# Patient Record
Sex: Female | Born: 1995 | Race: Black or African American | Hispanic: No | Marital: Single | Smoking: Never smoker
Health system: Southern US, Community
[De-identification: ages and names within clinical notes are randomized; demographics above are authoritative.]

## PROBLEM LIST (undated history)

## (undated) DIAGNOSIS — E669 Obesity, unspecified: Secondary | ICD-10-CM

---
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
## 2013-03-05 ENCOUNTER — Encounter: Payer: Self-pay | Admitting: Family Medicine

## 2013-03-05 ENCOUNTER — Ambulatory Visit (INDEPENDENT_AMBULATORY_CARE_PROVIDER_SITE_OTHER): Payer: Medicaid Other | Admitting: Family Medicine

## 2013-03-05 VITALS — BP 116/75 | HR 81 | Temp 99.0°F | Resp 16 | Ht 68.5 in | Wt 248.9 lb

## 2013-03-05 DIAGNOSIS — H6091 Unspecified otitis externa, right ear: Secondary | ICD-10-CM

## 2013-03-05 DIAGNOSIS — H60391 Other infective otitis externa, right ear: Secondary | ICD-10-CM

## 2013-03-05 DIAGNOSIS — H60399 Other infective otitis externa, unspecified ear: Secondary | ICD-10-CM

## 2013-03-05 MED ORDER — NEOMYCIN-COLIST-HC-THONZONIUM 3.3-3-10-0.5 MG/ML OT SUSP: 3.0000 [drp] | Freq: Four times a day (QID) | OTIC | Status: DC

## 2013-03-05 NOTE — Patient Instructions (Signed)
You have a condition called otitis externa. It should get better with drops. Come back if you get worse or still have pain in 5-7 days.   Sincerely,  Dr. Clinton Sawyer  Otitis Externa Otitis externa is a bacterial or fungal infection of the outer ear canal. This is the area from the eardrum to the outside of the ear. Otitis externa is sometimes called "swimmer's ear." CAUSES  Possible causes of infection include:  Swimming in dirty water.  Moisture remaining in the ear after swimming or bathing.  Mild injury (trauma) to the ear.  Objects stuck in the ear (foreign body).  Cuts or scrapes (abrasions) on the outside of the ear. SYMPTOMS  The first symptom of infection is often itching in the ear canal. Later signs and symptoms may include swelling and redness of the ear canal, ear pain, and yellowish-white fluid (pus) coming from the ear. The ear pain may be worse when pulling on the earlobe. DIAGNOSIS  Your caregiver will perform a physical exam. A sample of fluid may be taken from the ear and examined for bacteria or fungi. TREATMENT  Antibiotic ear drops are often given for 10 to 14 days. Treatment may also include pain medicine or corticosteroids to reduce itching and swelling. PREVENTION   Keep your ear dry. Use the corner of a towel to absorb water out of the ear canal after swimming or bathing.  Avoid scratching or putting objects inside your ear. This can damage the ear canal or remove the protective wax that lines the canal. This makes it easier for bacteria and fungi to grow.  Avoid swimming in lakes, polluted water, or poorly chlorinated pools.  You may use ear drops made of rubbing alcohol and vinegar after swimming. Combine equal parts of white vinegar and alcohol in a bottle. Put 3 or 4 drops into each ear after swimming. HOME CARE INSTRUCTIONS   Apply antibiotic ear drops to the ear canal as prescribed by your caregiver.  Only take over-the-counter or prescription  medicines for pain, discomfort, or fever as directed by your caregiver.  If you have diabetes, follow any additional treatment instructions from your caregiver.  Keep all follow-up appointments as directed by your caregiver. SEEK MEDICAL CARE IF:   You have a fever.  Your ear is still red, swollen, painful, or draining pus after 3 days.  Your redness, swelling, or pain gets worse.  You have a severe headache.  You have redness, swelling, pain, or tenderness in the area behind your ear. MAKE SURE YOU:   Understand these instructions.  Will watch your condition.  Will get help right away if you are not doing well or get worse. Document Released: 08/15/2005 Document Revised: 11/07/2011 Document Reviewed: 09/01/2011 [facility] Patient Information 2014 [place], [place].

## 2013-03-05 NOTE — Assessment & Plan Note (Signed)
Likely infectious etiology since it started after swimming last week. Will use corticosporin otic drops. Return to clinic if worsening symptoms.

## 2013-03-05 NOTE — Progress Notes (Signed)
  Subjective:    Patient ID: Normajean Glasgow, female    DOB: August 12, 1996, 17 y.o.   MRN: [id number]  HPI Right Sided Ear Pain   Symptoms: right sided ear pain - feels "swollen, " hurts if she puts pressure on it or touches it Associated symptoms: no fever, chills, nausea, vomiting, pharyngitis Location: right ear Duration: 4 days  Course: worsening over the last day Alleviating: nothing tried Exacerbating: nothing  History: swimming in a pool last week   Review of Systems     Objective:   Physical Exam BP 116/75  Pulse 81  Temp(Src) 99 F (37.2 C)  Resp 16  Ht 5' 8.5" (1.74 m)  Wt 248 lb 14.4 oz (112.9 kg)  BMI 37.29 kg/m2  SpO2 100% Well appearing  No lymphadenopathy submental or submanidbular, OP clear without erythema or exudate Right sided otalgia, swollen erythematous external ear canal, no effusion or bulging of the TM Left ear: normal      Assessment & Plan:  OTITIS EXTERNA, RIGHT

## 2013-03-05 NOTE — Progress Notes (Signed)
Patient complains of pain to right ear past  Couple of days.

## 2013-03-08 ENCOUNTER — Encounter: Payer: Self-pay | Admitting: Family Medicine

## 2013-03-08 NOTE — Progress Notes (Signed)
Ear drops that were prescribed at this visit require prior authorization - can pharmacy change to plain Cortisporin otic and not "TC". Wyatt Haste, RN-BSN

## 2013-03-08 NOTE — Telephone Encounter (Signed)
error 

## 2013-04-26 ENCOUNTER — Telehealth: Payer: Self-pay | Admitting: Family Medicine

## 2013-04-26 NOTE — Telephone Encounter (Signed)
It has been more than 1.5 months since these were prescribed. She needs to be evaluated before any prescription will be given, especially since it is now in both ears. She can come in today for a walk in visit or urgent care. Thanks.

## 2013-04-26 NOTE — Telephone Encounter (Signed)
Patient is calling because the medication that was prescribed for her ear infection is not covered by her insurance and now the infection is in both ears.  She is asking if a different medication can be prescribed that is covered by her insurance and enough for her to use in both ears.

## 2013-04-26 NOTE — Telephone Encounter (Signed)
Pt called and informed. Elizabeth Fulton Merry, RN-BSN  

## 2013-04-26 NOTE — Telephone Encounter (Signed)
Please see note below - pt has medicaid. Wyatt Haste, RN-BSN

## 2013-04-30 ENCOUNTER — Other Ambulatory Visit: Payer: Self-pay | Admitting: *Deleted

## 2013-04-30 DIAGNOSIS — H60391 Other infective otitis externa, right ear: Secondary | ICD-10-CM

## 2013-04-30 NOTE — Telephone Encounter (Signed)
This is the second request for this... She needs to be seen and re-evaluated since the antibiotics were prescribed well over a month ago and there was a previous note saying the infection was now in both ears. Thanks. Greig Castilla

## 2013-04-30 NOTE — Telephone Encounter (Signed)
[facility] called and informed that pt would need office visit prior to filling meds. Wyatt Haste, RN-BSN

## 2013-05-26 ENCOUNTER — Other Ambulatory Visit: Payer: Self-pay | Admitting: Family Medicine

## 2013-05-27 ENCOUNTER — Ambulatory Visit (INDEPENDENT_AMBULATORY_CARE_PROVIDER_SITE_OTHER): Payer: Medicaid Other | Admitting: Family Medicine

## 2013-05-27 ENCOUNTER — Encounter: Payer: Self-pay | Admitting: Family Medicine

## 2013-05-27 VITALS — BP 113/74 | HR 89 | Ht 68.5 in | Wt 250.0 lb

## 2013-05-27 DIAGNOSIS — H60399 Other infective otitis externa, unspecified ear: Secondary | ICD-10-CM

## 2013-05-27 DIAGNOSIS — R29898 Other symptoms and signs involving the musculoskeletal system: Secondary | ICD-10-CM | POA: Insufficient documentation

## 2013-05-27 DIAGNOSIS — H6091 Unspecified otitis externa, right ear: Secondary | ICD-10-CM

## 2013-05-27 NOTE — Progress Notes (Signed)
  Subjective:    Patient ID: Stacey Hendrix, female    DOB: Dec 31, 1995, 17 y.o.   MRN: [id number]  HPI  # Left upper arm weakness - Started a month ago, comes and goes, though usually at night and the morning (doesn't notice during the afternoon) - Feels "weak" and like her arm is going to "fall off", says "numb, but not numb" - Left upper arm only, does not occur in forearm or hand - Has nexplanon in the left arm, inserted April 2014. - No pain, no numbness or tingling - No history of trauma to the area, doesn't participate in physical activity that requires repetitive use of the arm. ROS: weight gain of 12lbs since March (only 2lbs since July), no radicular pain, no chest pain or shortness of breath, no other weakness around body  # Ear infection: - Had bilateral ear pain 1 month ago, has gone away and not recurred.  - Had ear pain in July and diagnosed with otitis externa, given prescription but wasn't able to fill due to insurance not covering it ROS: no rhinorrhea, cough, fevers, chills.  Review of Systems See HPI    Objective:   Physical Exam BP 113/74  Pulse 89  Ht 5' 8.5" (1.74 m)  Wt 250 lb (113.399 kg)  BMI 37.46 kg/m2  HEENT: TMs pearly gray with good light reflex bilaterally, cerumen present in ear canals CV: RRR, normal heart sounds, no murmurs appreciated Resp: CTAB, normal effort Extremities:  Left arm: nexplanon present in upper arm next to well healed insertion scar. No visible lesions on the arm. Strength 5/5 biceps, triceps, wrist flexion/extension, grip strength, finger abduction/adduction.  Right arm: Strength 5/5 biceps, triceps, wrist flexion/extension, grip strength, finger abduction/adduction.  2+ reflexes biceps, triceps, brachioradialis bilaterally 2+ radial pulses bilaterally     Assessment & Plan:  See Problem List Documentation

## 2013-05-27 NOTE — Assessment & Plan Note (Signed)
Never filled antibiotic prescription because her insurance did not cover it. Infection went away and came back in both ears in August. She was not seen by a provider and the pain went away. Plan: - She understands if it recurs she needs to be seen by someone so that the proper treatment can be prescribed.

## 2013-05-27 NOTE — Patient Instructions (Signed)
It was nice to meet you today.   The arm weakness does not appear to be anything that is concerning at this time. If the symptoms persist, worsen, you start to get pain, please call and schedule an appointment or go to the ED.   For your ear infections, we would need to see you in clinic or urgent care in order to determine the most appropriate treatment at that time.

## 2013-05-27 NOTE — Assessment & Plan Note (Signed)
Left upper arm only, present for 1 month, comes and goes. Same arm as nexplanon was inserted into in April. Denies trauma to the area, has not been participating in sports or recurrent physical activity using the arm. No pain, no actual weakness or numbness, doesn't describe nerve pain. Plan: - She does not want nexplanon removed - No treatment or imaging at this time - Schedule another appointment if symptoms worsen.

## 2013-06-28 ENCOUNTER — Ambulatory Visit (INDEPENDENT_AMBULATORY_CARE_PROVIDER_SITE_OTHER): Payer: Medicaid Other | Admitting: Family Medicine

## 2013-06-28 ENCOUNTER — Encounter: Payer: Self-pay | Admitting: Family Medicine

## 2013-06-28 VITALS — BP 116/64 | HR 80 | Temp 99.4°F | Ht 68.5 in | Wt 255.0 lb

## 2013-06-28 DIAGNOSIS — R300 Dysuria: Secondary | ICD-10-CM

## 2013-06-28 DIAGNOSIS — N898 Other specified noninflammatory disorders of vagina: Secondary | ICD-10-CM

## 2013-06-28 LAB — POCT WET PREP (WET MOUNT)

## 2013-06-28 LAB — POCT UA - MICROSCOPIC ONLY

## 2013-06-28 LAB — POCT URINALYSIS DIPSTICK
Bilirubin, UA: NEGATIVE
Blood, UA: NEGATIVE
Glucose, UA: NEGATIVE
Ketones, UA: NEGATIVE
Nitrite, UA: NEGATIVE

## 2013-06-28 NOTE — Patient Instructions (Signed)
Safe Sex Safe sex is about reducing the risk of giving or getting a sexually transmitted disease (STD). STDs are spread through sexual contact involving the genitals, mouth, or rectum. Some STDS can be cured and others cannot. Safe sex can also prevent unintended pregnancies.  SAFE SEX PRACTICES  Limit your sexual activity to only one partner who is only having sex with you.  Talk to your partner about their past partners, past STDs, and drug use.  Use a condom every time you have sexual intercourse. This includes vaginal, oral, and anal sexual activity. Both females and males should wear condoms during oral sex. Only use latex or polyurethane condoms and water-based lubricants. Petroleum-based lubricants or oils used to lubricate a condom will weaken the condom and increase the chance that it will break. The condom should be in place from the beginning to the end of sexual activity. Wearing a condom reduces, but does not completely eliminate, your risk of getting or giving a STD. STDs can be spread by contact with skin of surrounding areas.  Get vaccinated for hepatitis B and HPV.  Avoid alcohol and recreational drugs which can affect your judgement. You may forget to use a condom or participate in high-risk sex.  For females, avoid douching after sexual intercourse. Douching can spread an infection farther into the reproductive tract.  Check your body for signs of sores, blisters, rashes, or unusual discharge. See your caregiver if you notice any of these signs.  Avoid sexual contact if you have symptoms of an infection or are being treated for an STD. If you or your partner has herpes, avoid sexual contact when blisters are present. Use condoms at all other times.  See your caregiver for regular screenings, examinations, and tests for STDs. Before having sex with a new partner, each of you should be screened for STDs and talk about the results with your partner. BENEFITS OF SAFE SEX   There  is less of a chance of getting or giving an STD.  You can prevent unwanted or unintended pregnancies.  By discussing safer sex concerns with your partner, you may increase feelings of intimacy, comfort, trust, and honesty between the both of you. Document Released: 09/22/2004 Document Revised: 05/09/2012 Document Reviewed: 02/06/2012 [facility] Patient Information 2014 [place], [place].  It was a pleasure meeting you today. I will call you with result this evening.

## 2013-06-30 ENCOUNTER — Telehealth: Payer: Self-pay | Admitting: Family Medicine

## 2013-06-30 DIAGNOSIS — R300 Dysuria: Secondary | ICD-10-CM | POA: Insufficient documentation

## 2013-06-30 NOTE — Assessment & Plan Note (Addendum)
Wet prep obtained today is normal. UA is normal as well, patient informed of results by telephone. After exam and history I discussed with the patient the lacerations located at vaginal introitus. I explained this is likely the cause of her burning with urination. It was confirmed it was the exact location of discomfort. Discussion on safety warranted no red flags. Explained how lacerations can occur and ways to prevent them from occuring in the future. Patient was in understanding. AVS on safe sex practices given F/U PRN

## 2013-06-30 NOTE — Progress Notes (Signed)
Subjective:     Patient ID: Normajean Glasgow, female   DOB: 08/05/1996, 17 y.o.   MRN: [id number]  HPI  Dysuria: Patient reports vaginal irritation that started two days ago after shaving. She has had h/o of folliculitis and is uncertain, because of location, if this is the same thing. The discomfort occurs after urination and burns on the outside. She has no frequency of urination, or incomplete empty of bladder. She denies fever or abdominal pain. She is sexually active with one female partner (of a few years). Last intercourse was 2 days ago. LMP: on nexplanon, does not have monthly menses. She has no concerns of STD exposure and does not wish to be tested for G/C today.   Review of Systems Negative, with the exception of above mentioned in HPI  Objective:   Physical Exam BP 116/64  Pulse 80  Temp(Src) 99.4 F (37.4 C) (Oral)  Ht 5' 8.5" (1.74 m)  Wt 255 lb (115.667 kg)  BMI 38.20 kg/m2 Gen: NAD. Pleasant Abd: Soft. obese. NTND. BS present. no Masses palpated.  GYN:  External genitalia within normal limits, with small lacerations (x4) at introitus.  Vaginal mucosa pink, moist, normal rugae.  Nonfriable cervix without lesions, no discharge or bleeding noted on speculum exam.  Bimanual exam revealed normal, nongravid uterus.  No cervical motion tenderness. No adnexal masses bilaterally.

## 2013-06-30 NOTE — Telephone Encounter (Signed)
Please call Stacey Hendrix and inform her all her test are normal and with no signs of infection. Thanks.

## 2013-07-01 NOTE — Telephone Encounter (Signed)
Left message on voicemail. Stacey Hendrix S  

## 2013-07-21 ENCOUNTER — Other Ambulatory Visit: Payer: Self-pay | Admitting: Family Medicine

## 2013-07-23 ENCOUNTER — Encounter: Payer: Self-pay | Admitting: Family Medicine

## 2013-08-22 ENCOUNTER — Other Ambulatory Visit: Payer: Self-pay | Admitting: Family Medicine

## 2013-08-23 ENCOUNTER — Other Ambulatory Visit: Payer: Self-pay | Admitting: Family Medicine

## 2013-08-26 NOTE — Telephone Encounter (Signed)
Pt is aware of this. Stacey Hendrix,CMA  

## 2013-10-02 ENCOUNTER — Ambulatory Visit: Payer: Medicaid Other

## 2013-10-22 ENCOUNTER — Ambulatory Visit: Payer: Medicaid Other | Admitting: Family Medicine

## 2013-10-22 ENCOUNTER — Encounter (HOSPITAL_COMMUNITY): Payer: Self-pay | Admitting: Emergency Medicine

## 2013-10-22 DIAGNOSIS — R000 Tachycardia, unspecified: Secondary | ICD-10-CM | POA: Insufficient documentation

## 2013-10-22 DIAGNOSIS — R509 Fever, unspecified: Secondary | ICD-10-CM | POA: Insufficient documentation

## 2013-10-22 DIAGNOSIS — Z3202 Encounter for pregnancy test, result negative: Secondary | ICD-10-CM | POA: Insufficient documentation

## 2013-10-22 DIAGNOSIS — R197 Diarrhea, unspecified: Secondary | ICD-10-CM | POA: Insufficient documentation

## 2013-10-22 DIAGNOSIS — Z7982 Long term (current) use of aspirin: Secondary | ICD-10-CM | POA: Insufficient documentation

## 2013-10-22 DIAGNOSIS — R112 Nausea with vomiting, unspecified: Secondary | ICD-10-CM | POA: Insufficient documentation

## 2013-10-22 NOTE — ED Notes (Signed)
Pt. reports generalized body aches with emesis ,diarrhea and chills onset Saturday .

## 2013-10-23 ENCOUNTER — Ambulatory Visit: Payer: Medicaid Other | Admitting: Family Medicine

## 2013-10-23 ENCOUNTER — Emergency Department (HOSPITAL_COMMUNITY)
Admission: EM | Admit: 2013-10-23 | Discharge: 2013-10-23 | Disposition: A | Discharge status: Home / Self Care | Payer: Medicaid Other | Attending: Emergency Medicine | Admitting: Emergency Medicine

## 2013-10-23 DIAGNOSIS — R112 Nausea with vomiting, unspecified: Secondary | ICD-10-CM

## 2013-10-23 DIAGNOSIS — R197 Diarrhea, unspecified: Secondary | ICD-10-CM

## 2013-10-23 LAB — PREGNANCY, URINE: PREG TEST UR: NEGATIVE

## 2013-10-23 MED ORDER — PROMETHAZINE HCL 25 MG PO TABS: 25.0000 mg | ORAL_TABLET | Freq: Four times a day (QID) | ORAL | Status: DC | PRN

## 2013-10-23 MED ORDER — IBUPROFEN 800 MG PO TABS: 800.0000 mg | ORAL_TABLET | Freq: Three times a day (TID) | ORAL | Status: DC

## 2013-10-23 MED ORDER — ONDANSETRON HCL 4 MG/2ML IJ SOLN
4.0000 mg | Freq: Once | INTRAMUSCULAR | Status: AC
  Administered 2013-10-23: 4 mg via INTRAVENOUS
  Filled 2013-10-23: qty 2

## 2013-10-23 MED ORDER — SODIUM CHLORIDE 0.9 % IV BOLUS (SEPSIS)
1000.0000 mL | Freq: Once | INTRAVENOUS | Status: AC
  Administered 2013-10-23: 1000 mL via INTRAVENOUS

## 2013-10-23 MED ORDER — KETOROLAC TROMETHAMINE 60 MG/2ML IM SOLN
60.0000 mg | Freq: Once | INTRAMUSCULAR | Status: AC
  Administered 2013-10-23: 60 mg via INTRAMUSCULAR

## 2013-10-23 MED ORDER — KETOROLAC TROMETHAMINE 30 MG/ML IJ SOLN: 30.0000 mg | Freq: Once | INTRAMUSCULAR | Status: DC

## 2013-10-23 MED ORDER — KETOROLAC TROMETHAMINE 60 MG/2ML IM SOLN
INTRAMUSCULAR | Status: AC
  Filled 2013-10-23: qty 2

## 2013-10-23 NOTE — Discharge Instructions (Signed)
Viral Infections °A virus is a type of germ. Viruses can cause: °· Minor sore throats. °· Aches and pains. °· Headaches. °· Runny nose. °· Rashes. °· Watery eyes. °· Tiredness. °· Coughs. °· Loss of appetite. °· Feeling sick to your stomach (nausea). °· Throwing up (vomiting). °· Watery poop (diarrhea). °HOME CARE  °· Only take medicines as told by your doctor. °· Drink enough water and fluids to keep your pee (urine) clear or pale yellow. Sports drinks are a good choice. °· Get plenty of rest and eat healthy. Soups and broths with crackers or rice are fine. °GET HELP RIGHT AWAY IF:  °· You have a very bad headache. °· You have shortness of breath. °· You have chest pain or neck pain. °· You have an unusual rash. °· You cannot stop throwing up. °· You have watery poop that does not stop. °· You cannot keep fluids down. °· You or your child has a temperature by mouth above 102° F (38.9° C), not controlled by medicine. °· Your baby is older than 3 months with a rectal temperature of 102° F (38.9° C) or higher. °· Your baby is 3 months old or younger with a rectal temperature of 100.4° F (38° C) or higher. °MAKE SURE YOU:  °· Understand these instructions. °· Will watch this condition. °· Will get help right away if you are not doing well or get worse. °Document Released: 07/28/2008 Document Revised: 11/07/2011 Document Reviewed: 12/21/2010 °Exit[employer]® Patient Information ©2014 [employer]. ° °

## 2013-10-23 NOTE — ED Provider Notes (Signed)
CSN: [id number]     Arrival date & time 10/22/13  2025 History   First MD Initiated Contact with Patient 10/23/13 [phone]     Chief Complaint  Patient presents with  . Generalized Body Aches  . Emesis  . Diarrhea     (Consider location/radiation/quality/duration/timing/severity/associated sxs/prior Treatment) HPI History provided by patient. Onset of symptoms about 3 days ago, sick contacts at home with similar symptoms now resolved. Started with generalized body aches and some nausea and has now developed vomiting and diarrhea for the last 2 days unable to hold anything down. She hurts all of her denies any specific abdominal pain, chest pain. No cough or difficulty breathing. No sore throat. Has had some congestion. No rash or recent travel. Symptoms moderate severity. Patient concerned that she may have the flu. No blood in emesis or stools.  History reviewed. No pertinent past medical history. History reviewed. No pertinent past surgical history. Family History  Problem Relation Age of Onset  . Alcohol abuse Mother   . Depression Mother    History  Substance Use Topics  . Smoking status: Never Smoker   . Smokeless tobacco: Not on file  . Alcohol Use: No   OB History   Grav Para Term Preterm Abortions TAB SAB Ect Mult Living                 Review of Systems  Constitutional: Positive for fever and chills.  Eyes: Negative for visual disturbance.  Respiratory: Negative for shortness of breath.   Cardiovascular: Negative for chest pain.  Gastrointestinal: Positive for nausea, vomiting and diarrhea.  Genitourinary: Negative for dysuria.  Musculoskeletal: Negative for neck pain and neck stiffness.  Skin: Negative for rash.  Neurological: Negative for seizures and syncope.  All other systems reviewed and are negative.      Allergies  Review of patient's allergies indicates no known allergies.  Home Medications   Current Outpatient Rx  Name  Route  Sig  Dispense   Refill  . etonogestrel (NEXPLANON) 68 MG IMPL implant   Subcutaneous   Inject 1 each into the skin once. Implanted November 28, 2012         . Phenyleph-Doxyl-DM-Aspirin (ALKA-SELTZER PLUS DAY/NIGHT PO)   Oral   Take 2 tablets by mouth 2 (two) times daily as needed (cold/flu symptoms).          BP 115/53  Pulse 109  Temp(Src) 99.7 F (37.6 C) (Oral)  Resp 15  Ht 5\' 8"  (1.727 m)  Wt 266 lb (120.657 kg)  BMI 40.45 kg/m2  SpO2 100% Physical Exam  Constitutional: She is oriented to person, place, and time. She appears well-developed and well-nourished.  HENT:  Head: Normocephalic and atraumatic.  Mouth/Throat: No oropharyngeal exudate.  Eyes: EOM are normal. Pupils are equal, round, and reactive to light. No scleral icterus.  Neck: Neck supple. No tracheal deviation present.  Cardiovascular: Regular rhythm and intact distal pulses.   Tachycardic  Pulmonary/Chest: Effort normal and breath sounds normal. No stridor. No respiratory distress. She exhibits no tenderness.  Abdominal: Soft. Bowel sounds are normal. She exhibits no distension. There is no tenderness.  No CVA tenderness  Musculoskeletal: Normal range of motion. She exhibits no edema.  Neurological: She is alert and oriented to person, place, and time. No cranial nerve deficit. Coordination normal.  Skin: Skin is warm and dry.    ED Course  Procedures (including critical care time) Labs Review Labs Reviewed  PREGNANCY, URINE   IV fluids. IV  Zofran IV Toradol  2:54 AM tolerating PO fluids without further nausea or vomiting. Repeat abdominal exam remains soft, nontender, nondistended. Urine pregnancy test negative.    Plan discharge home with prescription for Phenergan and Motrin. Strict return precautions verbalized as understood. Patient agrees to rest and hydration.   MDM   Diagnosis: Nausea vomiting diarrhea  Suspect viral gastroenteritis. Serial abdominal exams benign Treated with IV fluids and medications  as above Condition improved Vital signs and nursing notes reviewed and considered    Stacey NielsenBrian Jacqueleen Pulver, MD 10/23/13 [phone]

## 2013-12-06 ENCOUNTER — Ambulatory Visit: Payer: Medicaid Other | Admitting: Family Medicine

## 2013-12-09 ENCOUNTER — Encounter: Payer: Self-pay | Admitting: Family Medicine

## 2013-12-09 ENCOUNTER — Ambulatory Visit (INDEPENDENT_AMBULATORY_CARE_PROVIDER_SITE_OTHER): Payer: Medicaid Other | Admitting: Family Medicine

## 2013-12-09 ENCOUNTER — Other Ambulatory Visit (HOSPITAL_COMMUNITY)
Admission: RE | Admit: 2013-12-09 | Discharge: 2013-12-09 | Disposition: A | Discharge status: Home / Self Care | Payer: Medicaid Other | Source: Ambulatory Visit | Attending: Family Medicine | Admitting: Family Medicine

## 2013-12-09 VITALS — BP 126/57 | HR 97 | Temp 99.3°F | Ht 69.0 in | Wt 271.0 lb

## 2013-12-09 DIAGNOSIS — N898 Other specified noninflammatory disorders of vagina: Secondary | ICD-10-CM

## 2013-12-09 DIAGNOSIS — J301 Allergic rhinitis due to pollen: Secondary | ICD-10-CM

## 2013-12-09 DIAGNOSIS — R634 Abnormal weight loss: Secondary | ICD-10-CM

## 2013-12-09 DIAGNOSIS — Z113 Encounter for screening for infections with a predominantly sexual mode of transmission: Secondary | ICD-10-CM | POA: Insufficient documentation

## 2013-12-09 DIAGNOSIS — L732 Hidradenitis suppurativa: Secondary | ICD-10-CM

## 2013-12-09 DIAGNOSIS — E669 Obesity, unspecified: Secondary | ICD-10-CM

## 2013-12-09 DIAGNOSIS — Z202 Contact with and (suspected) exposure to infections with a predominantly sexual mode of transmission: Secondary | ICD-10-CM

## 2013-12-09 LAB — POCT WET PREP (WET MOUNT): Clue Cells Wet Prep Whiff POC: NEGATIVE

## 2013-12-09 MED ORDER — LEVOCETIRIZINE DIHYDROCHLORIDE 5 MG PO TABS: 5.0000 mg | ORAL_TABLET | Freq: Every evening | ORAL | Status: DC

## 2013-12-09 NOTE — Assessment & Plan Note (Signed)
Wt up +21lbs since 04/2013.  P: referral placed for Dr. Gerilyn PilgrimSykes for nutritional therapy.

## 2013-12-09 NOTE — Assessment & Plan Note (Signed)
Worsened symptoms over past several weeks with high pollen counts. OTC medications without relief. P: xyzal 5mg  daily

## 2013-12-09 NOTE — Patient Instructions (Addendum)
I will call you if the results of your labs are not normal, otherwise we will send you a letter. If you do not hear from the clinic in 2 weeks, please call.  Schedule a "Same Day" appointment when any new bumps start to appear. It would be best if someone can examine you while you have them.

## 2013-12-09 NOTE — Assessment & Plan Note (Signed)
Suspect she is having additional flares (though no lesions currently). Will need to discuss further with her about weight loss (she has requested nutritional therapy referral), avoiding excessive soap use, diet control. Asked patient to schedule same day appt if further lesions appear (cannot say if this is exactly what she is referring to as there is nothing on exam).

## 2013-12-09 NOTE — Progress Notes (Signed)
Patient ID: Normajean GlasgowDasha V Stankey, female   DOB: 1995/12/09, 18 y.o.   MRN: [id number]   Subjective:    Patient ID: Normajean GlasgowDasha V Mittelstaedt, female    DOB: 1995/12/09, 18 y.o.   MRN: [id number]  HPI  CC: Bumps in groin  # Bumps / (?Hidradenitis):  First noticed when she was 18yo, has been seen multiple times for this problem  Occur sometimes in "waves", may have only a few but sometimes many more  Sometimes express pus, though does not mention bad odor  Painful ROS: no vaginal discharge, no excessive bleeding, no fevers/chills  # Allergies  Having worsened allergies over past several weeks  OTC medications like zyrtec don't seem to help ROS: +rhinorrhea, congestion, no cough, no rashes  Review of Systems   See HPI for ROS. Objective:  BP 126/57  Pulse 97  Temp(Src) 99.3 F (37.4 C) (Oral)  Ht 5\' 9"  (1.753 m)  Wt 271 lb (122.925 kg)  BMI 40.00 kg/m2  General: NAD HEENT: PERRL, EOMI, no scleral erythema. Nasal discharge present. CV: RRR, normal heart sounds, no murmurs Resp: CTAB, normal effort Ext: no edema or cyanosis.  GU: multiple hypopigmented scares around groin, vulva, 3 located near buttocks. No tender areas, no fluctuance or masses. Spec exam: NAVM, small amount white/green discharge. Bimanual: normal uterus, ovaries, no CMT  Above exam supervised by nurse     Assessment & Plan:  See Problem List Documentation  More than 50% of 25 minute visit spent face-to-face counseling with patient.

## 2013-12-10 LAB — CERVICOVAGINAL ANCILLARY ONLY
CHLAMYDIA, DNA PROBE: NEGATIVE
Neisseria Gonorrhea: NEGATIVE

## 2013-12-11 ENCOUNTER — Encounter: Payer: Self-pay | Admitting: Family Medicine

## 2013-12-13 ENCOUNTER — Other Ambulatory Visit (HOSPITAL_COMMUNITY)
Admission: RE | Admit: 2013-12-13 | Discharge: 2013-12-13 | Disposition: A | Discharge status: Home / Self Care | Payer: Medicaid Other | Source: Ambulatory Visit | Attending: Family Medicine | Admitting: Family Medicine

## 2013-12-13 ENCOUNTER — Emergency Department (INDEPENDENT_AMBULATORY_CARE_PROVIDER_SITE_OTHER)
Admission: EM | Admit: 2013-12-13 | Discharge: 2013-12-13 | Disposition: A | Discharge status: Home / Self Care | Payer: Medicaid Other | Source: Home / Self Care | Attending: Family Medicine | Admitting: Family Medicine

## 2013-12-13 DIAGNOSIS — N760 Acute vaginitis: Secondary | ICD-10-CM | POA: Insufficient documentation

## 2013-12-13 DIAGNOSIS — Z202 Contact with and (suspected) exposure to infections with a predominantly sexual mode of transmission: Secondary | ICD-10-CM

## 2013-12-13 DIAGNOSIS — Z113 Encounter for screening for infections with a predominantly sexual mode of transmission: Secondary | ICD-10-CM | POA: Insufficient documentation

## 2013-12-13 LAB — HIV ANTIBODY (ROUTINE TESTING W REFLEX): HIV 1&2 Ab, 4th Generation: NONREACTIVE

## 2013-12-13 LAB — POCT URINALYSIS DIP (DEVICE)
BILIRUBIN URINE: NEGATIVE
GLUCOSE, UA: NEGATIVE mg/dL
Ketones, ur: NEGATIVE mg/dL
Nitrite: NEGATIVE
Protein, ur: NEGATIVE mg/dL
Specific Gravity, Urine: 1.02 (ref 1.005–1.030)
UROBILINOGEN UA: 0.2 mg/dL (ref 0.0–1.0)
pH: 7 (ref 5.0–8.0)

## 2013-12-13 LAB — CERVICOVAGINAL ANCILLARY ONLY
WET PREP (BD AFFIRM): NEGATIVE
WET PREP (BD AFFIRM): POSITIVE — AB
Wet Prep (BD Affirm): NEGATIVE

## 2013-12-13 LAB — RPR

## 2013-12-13 LAB — POCT PREGNANCY, URINE: Preg Test, Ur: NEGATIVE

## 2013-12-13 NOTE — ED Provider Notes (Signed)
Stacey Hendrix is a 18 y.o. female who presents to Urgent Care today for STD exposure. Patient's wife recently tested positive for some unknown STD. She is here for testing. She is completely asymptomatic. He feels well otherwise.   No past medical history on file. History  Substance Use Topics  . Smoking status: Never Smoker   . Smokeless tobacco: Not on file  . Alcohol Use: No   ROS as above Medications: No current facility-administered medications for this encounter.   Current Outpatient Prescriptions  Medication Sig Dispense Refill  . etonogestrel (NEXPLANON) 68 MG IMPL implant Inject 1 each into the skin once. Implanted November 28, 2012      . ibuprofen (ADVIL,MOTRIN) 800 MG tablet Take 1 tablet (800 mg total) by mouth 3 (three) times daily.  21 tablet  0  . levocetirizine (XYZAL) 5 MG tablet Take 1 tablet (5 mg total) by mouth every evening.  30 tablet  11  . Phenyleph-Doxyl-DM-Aspirin (ALKA-SELTZER PLUS DAY/NIGHT PO) Take 2 tablets by mouth 2 (two) times daily as needed (cold/flu symptoms).      . promethazine (PHENERGAN) 25 MG tablet Take 1 tablet (25 mg total) by mouth every 6 (six) hours as needed for nausea or vomiting.  30 tablet  0    Exam:  BP 109/59  Pulse 76  Temp(Src) 98 F (36.7 C) (Oral)  Resp 16  SpO2 100% Gen: Well NAD GYN: Normal external genitalia. Vaginal canal with no discharge. Normal-appearing cervix. Nontender.  Results for orders placed during the hospital encounter of 12/13/13 (from the past 24 hour(s))  POCT URINALYSIS DIP (DEVICE)     Status: Abnormal   Collection Time    12/13/13 11:03 AM      Result Value Ref Range   Glucose, UA NEGATIVE  NEGATIVE mg/dL   Bilirubin Urine NEGATIVE  NEGATIVE   Ketones, ur NEGATIVE  NEGATIVE mg/dL   Specific Gravity, Urine 1.020  1.005 - 1.030   Hgb urine dipstick MODERATE (*) NEGATIVE   pH 7.0  5.0 - 8.0   Protein, ur NEGATIVE  NEGATIVE mg/dL   Urobilinogen, UA 0.2  0.0 - 1.0 mg/dL   Nitrite NEGATIVE   NEGATIVE   Leukocytes, UA TRACE (*) NEGATIVE  POCT PREGNANCY, URINE     Status: None   Collection Time    12/13/13 11:13 AM      Result Value Ref Range   Preg Test, Ur NEGATIVE  NEGATIVE   No results found.  Assessment and Plan: 18 y.o. female with STD exposure. Cytology pending. Will call in antibiotics as needed.  Discussed warning signs or symptoms. Please see discharge instructions. Patient expresses understanding.    Rodolph BongEvan S Dominik Yordy, MD 12/13/13 [phone]

## 2013-12-13 NOTE — Discharge Instructions (Signed)
Thank you for coming in today. I will call you with test results if they are positive.  Come back as needed. If your belly pain worsens, or you have high fever, bad vomiting, blood in your stool or black tarry stool go to the Emergency Room.

## 2013-12-14 ENCOUNTER — Telehealth (HOSPITAL_COMMUNITY): Payer: Self-pay | Admitting: Family Medicine

## 2013-12-14 MED ORDER — METRONIDAZOLE 500 MG PO TABS: 500.0000 mg | ORAL_TABLET | Freq: Two times a day (BID) | ORAL | Status: DC

## 2013-12-14 NOTE — ED Notes (Signed)
BV positive Flagyl called in.   Rodolph BongEvan S Datha Kissinger, MD 12/14/13 [phone]

## 2013-12-14 NOTE — Telephone Encounter (Signed)
Message copied by Rodolph BongOREY, Tran Arzuaga S on Sat Dec 14, 2013  8:41 AM ------      Message from: Vassie MoselleYORK, SUZANNE M      Created: Fri Dec 13, 2013 11:06 PM      Regarding: lab       Gardnerella pos. Rest of labs pending. Did you want to treat?      Desiree LucySuzanne M York      12/13/2013       ------

## 2013-12-16 LAB — CERVICOVAGINAL ANCILLARY ONLY
Chlamydia: NEGATIVE
NEISSERIA GONORRHEA: NEGATIVE

## 2013-12-16 NOTE — ED Notes (Addendum)
HIV/RPR non-reactive, GC/Chlamydia neg.,  Gardnerella pos., rest of labs neg. Dr. Denyse Amassorey. wrote that he e-prescribed Flagyl and notified pt. of her results. Stacey Hendrix 12/16/2013

## 2014-06-14 ENCOUNTER — Emergency Department (HOSPITAL_COMMUNITY)
Admission: EM | Admit: 2014-06-14 | Discharge: 2014-06-14 | Disposition: A | Discharge status: Home / Self Care | Payer: Medicaid Other | Attending: Emergency Medicine | Admitting: Emergency Medicine

## 2014-06-14 ENCOUNTER — Encounter (HOSPITAL_COMMUNITY): Payer: Self-pay | Admitting: Emergency Medicine

## 2014-06-14 DIAGNOSIS — L739 Follicular disorder, unspecified: Secondary | ICD-10-CM | POA: Diagnosis not present

## 2014-06-14 DIAGNOSIS — R102 Pelvic and perineal pain: Secondary | ICD-10-CM | POA: Diagnosis present

## 2014-06-14 MED ORDER — IBUPROFEN 800 MG PO TABS: 800.0000 mg | ORAL_TABLET | Freq: Three times a day (TID) | ORAL | Status: DC | PRN

## 2014-06-14 MED ORDER — DOXYCYCLINE HYCLATE 100 MG PO CAPS: 100.0000 mg | ORAL_CAPSULE | Freq: Two times a day (BID) | ORAL | Status: DC

## 2014-06-14 NOTE — ED Notes (Signed)
Declined W/C at D/C and was escorted to lobby by RN. 

## 2014-06-14 NOTE — ED Notes (Signed)
Per pt sts vaginal bumps that have been there since she as 8. sts that she has been told it is bumps from shaving in the past and folliculitis. sts hx also of random abscesses.

## 2014-06-14 NOTE — ED Provider Notes (Signed)
CSN: [id number]     Arrival date & time 06/14/14  1346 History  This chart was scribed for non-physician practitioner Trixie DredgeEmily Abena Erdman working with Warnell Foresterrey Wofford, MD by Carl Bestelina Holson, ED Scribe. This patient was seen in room TR11C/TR11C and the patient's care was started at 3:40 PM.      Chief Complaint  Patient presents with  . Vaginal Pain    Patient is a 18 y.o. female presenting with vaginal pain. The history is provided by the patient. No language interpreter was used.  Vaginal Pain Pertinent negatives include no abdominal pain.  Vaginal Pain Pertinent negatives include no abdominal pain, chills, fever, myalgias, nausea or vomiting.   HPI Comments: Stacey Hendrix is a 18 y.o. female with a history of abscesses and folliculitis of the genital and buttock areas presents to the Emergency Department complaining of a painful vaginal lesions that started a week and a half ago.  She states that the abscess felt bigger in size after she took a shower last night.  She states that there was some mild draining from the abscess last night as well.  Her pain was a 9/10 yesterday while she was working yesterday.  The drainage is bloody and purulent.  She shaves her vaginal area regularly and only uses Dove soap in the area.    She has had vaginal abscesses since she was 8 but states that she is able to drain them herself.  She states that they normally drain pus and blood.  She does not get abscesses in her axilla or any other part of her body.   They never appear blister-like and never drain clear liquid.  Has had thorough STD tests that are all negative, has only had one sexual partner.  She denies fever, abdominal pain, dysuria, frequency, urgency, myalgias, chills, diarrhea, nausea, vomiting, vaginal bleeding, abnormal bowel movements, and vaginal discharge as associated symptoms.   History reviewed. No pertinent past medical history. History reviewed. No pertinent past surgical history. Family History   Problem Relation Age of Onset  . Alcohol abuse Mother   . Depression Mother    History  Substance Use Topics  . Smoking status: Never Smoker   . Smokeless tobacco: Not on file  . Alcohol Use: No   OB History   Grav Para Term Preterm Abortions TAB SAB Ect Mult Living                 Review of Systems  Constitutional: Negative for fever and chills.  Gastrointestinal: Negative for nausea, vomiting and abdominal pain.  Genitourinary: Negative for dysuria, urgency, frequency, decreased urine volume, vaginal bleeding, difficulty urinating and vaginal pain.  Musculoskeletal: Negative for myalgias.  Skin:       Multiple skin lesions  Allergic/Immunologic: Negative for immunocompromised state.  All other systems reviewed and are negative.     Allergies  Review of patient's allergies indicates no known allergies.  Home Medications   Prior to Admission medications   Medication Sig Start Date End Date Taking? Authorizing Provider  etonogestrel (NEXPLANON) 68 MG IMPL implant Inject 1 each into the skin once. Implanted November 28, 2012   Yes Historical Provider, MD   BP 136/115  Pulse 82  Temp(Src) 98.3 F (36.8 C) (Oral)  Resp 18  SpO2 99% Physical Exam  Nursing note and vitals reviewed. Constitutional: She appears well-developed and well-nourished. No distress.  HENT:  Head: Normocephalic and atraumatic.  Neck: Neck supple.  Pulmonary/Chest: Effort normal.  Neurological: She is alert.  Skin: Lesion noted. She is not diaphoretic.  Various pustules in different states of healing.  Some pustules intact.  No large areas of abscess or fluctuance.  No overlying erythema, edema, warmth, or significant tenderness.  No active drainage.  Few lesions with ulcerations including right labia majora.    ED Course  Procedures (including critical care time)  DIAGNOSTIC STUDIES: Oxygen Saturation is 99% on room air, normal by my interpretation.    COORDINATION OF CARE: 3:44 PM-Will  discharge the patient with antibiotics, a referral to dermatology, and pain medication.  Advised the patient to follow-up with her PCP.  The patient agreed to the treatment plan.   Labs Review Labs Reviewed - No data to display  Imaging Review No results found.   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  No current abscess.  No e/o cellulitis.  Lesion of concern on right labia majora drained spontaneously last night.  No apparent abscess - ulcerated lesion resulted.  Per patient's description doubt herpes.   D/C home with doxycycline, ibuprofen, dermatology follow up.   Discussed result, findings, treatment, and follow up  with patient.  Pt given return precautions.  Pt verbalizes understanding and agrees with plan.       I personally performed the services described in this documentation, which was scribed in my presence. The recorded information has been reviewed and is accurate.    Trixie Dredgemily Ariann Khaimov, PA-C 06/14/14 1717

## 2014-06-14 NOTE — ED Notes (Signed)
Pt getting undressed and into gown for exam.

## 2014-06-14 NOTE — Discharge Instructions (Signed)
Read the information below.  Use the prescribed medication as directed.  Please discuss all new medications with your pharmacist.  You may return to the Emergency Department at any time for worsening condition or any new symptoms that concern you.  If you develop redness, swelling, pus draining from the wound, or fevers greater than 100.4, return to the ER immediately for a recheck.     Folliculitis  Folliculitis is redness, soreness, and swelling (inflammation) of the hair follicles. This condition can occur anywhere on the body. People with weakened immune systems, diabetes, or obesity have a greater risk of getting folliculitis. CAUSES  Bacterial infection. This is the most common cause.  Fungal infection.  Viral infection.  Contact with certain chemicals, especially oils and tars. Long-term folliculitis can result from bacteria that live in the nostrils. The bacteria may trigger multiple outbreaks of folliculitis over time. SYMPTOMS Folliculitis most commonly occurs on the scalp, thighs, legs, back, buttocks, and areas where hair is shaved frequently. An early sign of folliculitis is a small, white or yellow, pus-filled, itchy lesion (pustule). These lesions appear on a red, inflamed follicle. They are usually less than 0.2 inches (5 mm) wide. When there is an infection of the follicle that goes deeper, it becomes a boil or furuncle. A group of closely packed boils creates a larger lesion (carbuncle). Carbuncles tend to occur in hairy, sweaty areas of the body. DIAGNOSIS  Your caregiver can usually tell what is wrong by doing a physical exam. A sample may be taken from one of the lesions and tested in a lab. This can help determine what is causing your folliculitis. TREATMENT  Treatment may include:  Applying warm compresses to the affected areas.  Taking antibiotic medicines orally or applying them to the skin.  Draining the lesions if they contain a large amount of pus or  fluid.  Laser hair removal for cases of long-lasting folliculitis. This helps to prevent regrowth of the hair. HOME CARE INSTRUCTIONS  Apply warm compresses to the affected areas as directed by your caregiver.  If antibiotics are prescribed, take them as directed. Finish them even if you start to feel better.  You may take over-the-counter medicines to relieve itching.  Do not shave irritated skin.  Follow up with your caregiver as directed. SEEK IMMEDIATE MEDICAL CARE IF:   You have increasing redness, swelling, or pain in the affected area.  You have a fever. MAKE SURE YOU:  Understand these instructions.  Will watch your condition.  Will get help right away if you are not doing well or get worse. Document Released: 10/24/2001 Document Revised: 02/14/2012 Document Reviewed: 11/15/2011 [facility] Patient Information 2015 [place], [place]. This information is not intended to replace advice given to you by your health care provider. Make sure you discuss any questions you have with your health care provider.   Emergency Department Resource Guide 1) Find a Doctor and Pay Out of Pocket Although you won't have to find out who is covered by your insurance plan, it is a good idea to ask around and get recommendations. You will then need to call the office and see if the doctor you have chosen will accept you as a new patient and what types of options they offer for patients who are self-pay. Some doctors offer discounts or will set up payment plans for their patients who do not have insurance, but you will need to ask so you aren't surprised when you get to your appointment.  2) Contact  Your Local Health Department Not all health departments have doctors that can see patients for sick visits, but many do, so it is worth a call to see if yours does. If you don't know where your local health department is, you can check in your phone book. The CDC also has a tool to help you locate your  state's health department, and many state websites also have listings of all of their local health departments.  3) Find a Walk-in Clinic If your illness is not likely to be very severe or complicated, you may want to try a walk in clinic. These are popping up all over the country in pharmacies, drugstores, and shopping centers. They're usually staffed by nurse practitioners or physician assistants that have been trained to treat common illnesses and complaints. They're usually fairly quick and inexpensive. However, if you have serious medical issues or chronic medical problems, these are probably not your best option.  No Primary Care Doctor: - Call Health Connect at  [phone] - they can help you locate a primary care doctor that  accepts your insurance, provides certain services, etc. - Physician Referral Service- [phone]  Chronic Pain Problems: Organization         Address  Phone   Notes  [facility]  [phone] Patients need to be referred by their primary care doctor.   Medication Assistance: Organization         Address  Phone   Notes  [facility] Medication [facility] [address] [place] [place], [place] [address] [phone] --Must be a resident of [facility] -- Must have NO insurance coverage whatsoever (no Medicaid/ Medicare, etc.) -- The pt. MUST have a primary care doctor that directs their care regularly and follows them in the community   MedAssist  [phone]   [employer]  [phone]    Agencies that provide inexpensive medical care: Organization         Address  Phone   Notes  [name]  [phone]   [name]    [phone]   [facility] [address] [place], [place] [address] [phone]   [facility] [address] [place] [place] [phone]   Planned Parenthood    [phone]   [facility]    [phone]   [facility]  [address], [place] Phone:  [phone], Fax:  [phone] Hours of Operation:  9 am - 6 pm, M-F.  Also accepts Medicaid/Medicare and self-pay.  [facility]  [address], [place] Phone: [phone], Fax: [phone]. Hours of Operation:  8:30 am - 5:30 pm, M-F.  Also accepts Medicaid and self-pay.  [facility] [place] [address], [place] Phone: [phone]   [facility] [address] Natasha Bence [place], [place] [phone], Ext. [phone] Mondays & Thursdays: 7-9 AM.  First 15 patients are seen on a first come, first serve basis.    Medicaid-accepting [facility] Providers:  Organization         Address  Phone   Notes  [facility] [address], [place] [phone] Also accepts self-pay patients.  [facility] [address] Laurell Josephs [place], [place]  [phone]   [facility] [address], [address] (  [phone]) [phone]   [facility] [address] [place] [address], [place] [phone]) [phone]   Renaye RakersVeita Bland [address], [place]   [phone]) [phone] Only accepts [place] Access [place] patients after they have their name applied to their card.   Self-Pay (no insurance) in [facility]:  Organization         Address  Phone   Notes  Sickle Cell Patients, [facility] [address], [place] [phone]   [facility] [address], [place] [phone]) [phone]   [facility] [place]  [address], [place] [phone]) [phone]   [facility]/Dr. Osei-Bonsu  [address] [place] [address], [place] or [phone], [place] [phone] Phone number for both [place] and [place] locations is the same.  [facility]gent Medical and [facility] [address], [place] [phone])  [phone]   [facility] [place] [address] [place] [address], [place] or [address] [phone]   [facility] [address], [place] [phone]) [phone], phone; [phone]) [phone], fax Sees patients 1st and 3rd Saturday of every month.  Must not qualify for public or private insurance (i.e. Medicaid, Medicare, [place] Health Choice, Veterans' Benefits)  Household income should be no more than 200% of the poverty level The clinic cannot treat you if you are pregnant or think you are pregnant  Sexually transmitted diseases are not treated at the clinic.    Dental Care: Organization         Address  Phone  Notes  [facility] [address], [place] [phone]) [phone] Accepts children up to age 20 who are enrolled in [place]icaid or [place] Health Choice; pregnant women with a Medicaid card; and children who have applied for Medicaid or [place] Health Choice, but were declined, whose parents can pay a reduced fee at time of service.  [facility] [place]  [address], [place] [phone]) [phone] Accepts children up to age 20 who are enrolled in [place]icaid or [place] Health Choice; pregnant women with a Medicaid card; and children who have applied for Medicaid or  Health Choice, but were declined, whose parents can pay a reduced fee at time of service.  [facility]  [address], [place] [phone]) [phone] Patients are seen by appointment only. Walk-ins are not accepted. [facility] will see patients 20 years of age and older. Monday - Tuesday (8am-5pm) Most Wednesdays (8:30-5pm) $30 per visit, cash only  [facility]  [address], [facility] [phone]) [phone] Patients are seen by appointment only. Walk-ins are not accepted. [facility] will see patients 20 years of age and older. One Wednesday Evening (Monthly: Volunteer  Based).  $30 per visit, cash only  [employer] [facility]  [phone] for adults; Children under age 154, call Graduate Pediatric Dentistry at [phone]) [phone]. Children aged 20-14, please call [phone] to request a pediatric application.  Dental services are provided in all areas of dental care including fillings, crowns and bridges, complete and partial dentures, implants, gum treatment, root canals, and extractions. Preventive care is also provided. Treatment is provided to both adults and children. Patients are selected via a lottery and there is often a waiting list.   [facility] [address], [place]  (  336) [phone] [email or web address]   [facility] [address], [place], [place] [phone], Ext. [phone] Second and Fourth Thursday of each month, opens at 6:30 AM; Clinic ends at 9 AM.  Patients are seen on a first-come first-served basis, and a limited number are seen during each clinic.   [facility]  [address], [place], [place] [phone]) [phone]   Eligibility Requirements You must have lived in [place], [place], or [place] counties for at least the last three months.   You cannot be eligible for state or federal sponsored National Cityhealthcare insurance, including CIGNAVeterans Administration, [place]icaid, or [employer] EntertainmentMedicare.   You generally cannot be eligible for healthcare insurance through your employer.    How to apply: Eligibility screenings are held every Tuesday and Wednesday afternoon from 1:00 pm until 4:00 pm. You do not need an appointment for the interview!  [facility] [address], [place], [place] [phone]   [facility]  [phone]   [facility]  [phone]   [facility]  [phone]    Behavioral Health Resources in the Community: Intensive Outpatient Programs Organization         Address  Phone  Notes  [facility]  Services [address], [place], [place] [phone]   [facility] Outpatient [address], [place], [place] [phone]   [facility] [address], [place], [place]  [phone]   [facility] [address],  [place], [place] [phone] or [phone]   Substance Abuse Resources Organization         Address  Phone  Notes  [facility]cohol [facility] Drug Services  [phone]   [facility]  [phone]   [facility]  [phone]   [facility]  [phone]   Residential & Outpatient Substance Abuse Program  [phone]   Psychological Services Organization         Address  Phone  Notes  [facility]  [phone]- [phone]   [facility]  [phone]- [phone]   [facility] [address], [place] [phone] or [phone]    Mobile Crisis Teams Organization         Address  Phone  Notes  [facility], Mobile Crisis Care Unit  [phone]   [facility]  [address] [place], [place] [phone]   Doristine LocksSharon DeEsch [address] [place] [phone]    Self-Help/Support Groups Organization         Address  Phone             Notes  [facility] [place] - variety of support groups  [phone]- [phone] Call for more information  Narcotics Anonymous (NA), [facility] [address], [place] Joes  2 meetings at this location   Statisticianesidential Treatment Programs Organization         Address  Phone  Notes  [facility] [address],    [place]  [phone]   [facility]  [address], [place] [phone], [place], [place] [phone]   [facility] [address], [place] [phone] Admissions: 8am-3pm M-F  [facility] [address]    [place], [place] [phone]   [facility] [address], [place], [place] [phone]    [facility] [address]  [place], [place] [phone]   [facility] -  Intensive Outpatient [address], [place], [place] [phone]   [facility] [address].,  [place], [place] [phone] or [phone]   [facility] (RTS) [address] [place], [place] Accepts Medicaid  [facility] [place] [address],  [place] [phone] Substance Abuse/Addiction Treatment   [place] Organization         Address  Phone  Notes  [facility]  [phone]   Domenic Schwab, PhD [address] Arlis Porta [place], [place]   [phone] or [phone]   [place], [place] [phone]   [address], [place], [place] [phone] Insurance/Medicaid/sponsorship through [facility] and Families [address], [address]                                    [place], [place] [phone] [place] [address], [place] [phone]    Dr. Adele Schilder  [phone]   [facility] Dept. 1) [address], [place] 2) [place] 3)  [place] [address], [place] [phone]  [phone]   [place] [phone] or [phone] (After Hours)

## 2014-06-15 NOTE — ED Provider Notes (Signed)
Medical screening examination/treatment/procedure(s) were performed by non-physician practitioner and as supervising physician I was immediately available for consultation/collaboration.   Warnell Foresterrey Olimpia Tinch, MD 06/15/14 [phone]

## 2014-06-16 ENCOUNTER — Telehealth: Payer: Self-pay | Admitting: Family Medicine

## 2014-06-16 DIAGNOSIS — L732 Hidradenitis suppurativa: Secondary | ICD-10-CM

## 2014-06-16 NOTE — Telephone Encounter (Signed)
Pt was seen in the ER and [facility] When she called them they told her they do not take Medicaid. I tried to make an appointment in our Derm clinic but November schedule is not available yet. Please call patient and advise. jw

## 2014-06-19 NOTE — Telephone Encounter (Signed)
Referral placed for other derm clinic [facility] that accepts Medicaid. Attempted to call patient and let her know, if she calls back please let her know we made another referral. -Dr. Waynetta SandyWight

## 2014-06-23 NOTE — Telephone Encounter (Signed)
According to notes in referral we are waiting to hear back from pt. Stacey Hendrix, Stacey RochesterJessica Hendrix

## 2014-07-01 ENCOUNTER — Encounter: Payer: Medicaid Other | Admitting: Family Medicine

## 2014-07-07 ENCOUNTER — Encounter (HOSPITAL_COMMUNITY): Payer: Self-pay | Admitting: *Deleted

## 2014-07-07 ENCOUNTER — Emergency Department (HOSPITAL_COMMUNITY)
Admission: EM | Admit: 2014-07-07 | Discharge: 2014-07-07 | Disposition: A | Discharge status: Home / Self Care | Payer: Medicaid Other | Attending: Emergency Medicine | Admitting: Emergency Medicine

## 2014-07-07 DIAGNOSIS — R21 Rash and other nonspecific skin eruption: Secondary | ICD-10-CM | POA: Insufficient documentation

## 2014-07-07 MED ORDER — DIPHENHYDRAMINE HCL 25 MG PO TABS: 25.0000 mg | ORAL_TABLET | Freq: Four times a day (QID) | ORAL | Status: DC

## 2014-07-07 MED ORDER — PREDNISONE 10 MG PO TABS: ORAL_TABLET | ORAL | Status: DC

## 2014-07-07 MED ORDER — PREDNISONE 20 MG PO TABS
60.0000 mg | ORAL_TABLET | Freq: Once | ORAL | Status: AC
  Administered 2014-07-07: 60 mg via ORAL
  Filled 2014-07-07: qty 3

## 2014-07-07 MED ORDER — FAMOTIDINE 20 MG PO TABS
20.0000 mg | ORAL_TABLET | Freq: Once | ORAL | Status: AC
  Administered 2014-07-07: 20 mg via ORAL
  Filled 2014-07-07: qty 1

## 2014-07-07 MED ORDER — FAMOTIDINE 20 MG PO TABS: 20.0000 mg | ORAL_TABLET | Freq: Two times a day (BID) | ORAL | Status: DC

## 2014-07-07 NOTE — Discharge Instructions (Signed)
Take benadryl and pepcid for itching and reaction. Prednisone as prescribed until all gone. Follow up with your doctor for recheck. Return if worsening symptoms.   Drug Rash Skin reactions can be caused by several different drugs. Allergy to the medicine can cause itching, hives, and other rashes. Sun exposure causes a red rash with some medicines. Mononucleosis virus can cause a similar red rash when you are taking antibiotics. Sometimes, the rash may be accompanied by pain. The drug rash may happen with new drugs or with medicines that you have been taking for a while. The rash cannot be spread from person to person. In most cases, the symptoms of a drug rash are gone within a few days of stopping the medicine. Your rash, including hives (urticaria), is most likely from the following medicines:  Antibiotics or antimicrobials.  Anticonvulsants or seizure medicines.  Antihypertensives or blood pressure medicines.  Antimalarials.  Antidepressants or depression medicines.  Antianxiety drugs.  Diuretics or water pills.  Nonsteroidal anti-inflammatory drugs.  Simvastatin.  Lithium.  Omeprazole.  Allopurinol.  Pseudoephedrine.  Amiodarone.  Packed red blood cells, when you get a blood transfusion.  Contrast media, such as when getting an imaging test (CT or CAT scan). This drug list is not all inclusive, but drug rashes have been reported with all the medicines listed above. Your caregiver will tell you which medicines to avoid. If you react to a medicine, a similar or worse reaction can occur the next time you take it. If you need to stop taking an antibiotic because of a drug rash, an alternative antibiotic may be needed to get rid of your infection. Antihistamine or cortisone drugs may be prescribed to help relieve your symptoms. Stay out of the sun until the rash is completely gone.  Be sure to let your caregiver know about your drug reaction. Do not take this medicine in the  future. Call your caregiver if your drug rash does not improve within 3 to 4 days. SEEK IMMEDIATE MEDICAL CARE IF:   You develop breathing problems, swelling in the throat, or wheezing.  You have weakness, fainting, fever, and muscle or joint pains.  You develop blisters or peeling of skin, especially around the mouth. Document Released: 09/22/2004 Document Revised: 12/30/2013 Document Reviewed: 07/03/2008 [facility] Patient Information 2015 [place], [place]. This information is not intended to replace advice given to you by your health care provider. Make sure you discuss any questions you have with your health care provider.

## 2014-07-07 NOTE — ED Notes (Signed)
The pt has had a rash on her upper torso for 5 days  After she washed her bra in i=euither  Tide or gain detergent

## 2014-07-07 NOTE — ED Provider Notes (Signed)
CSN: [id number]     Arrival date & time 07/07/14  0011 History   First MD Initiated Contact with Patient 07/07/14 0017     Chief Complaint  Patient presents with  . Rash     (Consider location/radiation/quality/duration/timing/severity/associated sxs/prior Treatment) HPI Stacey Hendrix is a 18 y.o. female who presents to ED with complaint of a rash. Pt states she has had rash for about 5 days. Rash is over her chest and back. States started after she wore a bra that she has not worn in a while. Thought it may be due to a detergent she used to wash it with. Has been putting on desitin cream with no relief of symptoms. Rash is itchy. No pain. No drainage. Pt also admits being on doxycyline recently for foliculitis of groin. States rash started right after she finished taking it. Denies fever or chills. No rash over face or extremities. No rash to the groin or axilla. No one in the family with the same. No new products. No SOB, swelling or rash over lips or oral mucosa.   History reviewed. No pertinent past medical history. History reviewed. No pertinent past surgical history. Family History  Problem Relation Age of Onset  . Alcohol abuse Mother   . Depression Mother    History  Substance Use Topics  . Smoking status: Never Smoker   . Smokeless tobacco: Not on file  . Alcohol Use: No   OB History    No data available     Review of Systems  Constitutional: Negative for fever and chills.  Respiratory: Negative for cough, chest tightness and shortness of breath.   Cardiovascular: Negative for chest pain, palpitations and leg swelling.  Gastrointestinal: Negative for nausea, vomiting, abdominal pain and diarrhea.  Genitourinary: Negative for dysuria, flank pain, vaginal bleeding, vaginal discharge, vaginal pain and pelvic pain.  Musculoskeletal: Negative for myalgias, arthralgias, neck pain and neck stiffness.  Skin: Positive for rash.  Neurological: Negative for dizziness, weakness  and headaches.  All other systems reviewed and are negative.     Allergies  Review of patient's allergies indicates no known allergies.  Home Medications   Prior to Admission medications   Medication Sig Start Date End Date Taking? Authorizing Provider  doxycycline (VIBRAMYCIN) 100 MG capsule Take 1 capsule (100 mg total) by mouth 2 (two) times daily. One po bid x 7 days 06/14/14   Trixie DredgeEmily West, PA-C  etonogestrel (NEXPLANON) 68 MG IMPL implant Inject 1 each into the skin once. Implanted November 28, 2012    Historical Provider, MD  ibuprofen (ADVIL,MOTRIN) 800 MG tablet Take 1 tablet (800 mg total) by mouth every 8 (eight) hours as needed for mild pain or moderate pain. 06/14/14   Trixie DredgeEmily West, PA-C   BP 138/72 mmHg  Pulse 92  Temp(Src) 98.1 F (36.7 C) (Oral)  Resp 20  SpO2 100%  LMP 07/07/2014 Physical Exam  Constitutional: She appears well-developed and well-nourished. No distress.  HENT:  Head: Normocephalic.  No rash over lips or oral mucosa  Eyes: Conjunctivae are normal.  Neck: Neck supple.  Cardiovascular: Normal rate, regular rhythm and normal heart sounds.   Pulmonary/Chest: Effort normal and breath sounds normal. No respiratory distress. She has no wheezes. She has no rales.  Musculoskeletal: She exhibits no edema.  Neurological: She is alert.  Skin: Skin is warm and dry.  Diffuse dry, raised, papular rash to bilateral chest, abdomen, upper and lower back. Excoriations noted.   Psychiatric: She has a normal mood  and affect. Her behavior is normal.  Nursing note and vitals reviewed.   ED Course  Procedures (including critical care time) Labs Review Labs Reviewed - No data to display  Imaging Review No results found.   EKG Interpretation None      MDM   Final diagnoses:  None    Pt with rash to the torso. Possibly from wearing the bra/detergent vs from doxyclyn. Will start on prednisone taper, benadryl, pepcid. At this time, non toxic, normal vs, no  respiratory complaints. Follow up as needed.   Filed Vitals:   07/07/14 0022  BP: 138/72  Pulse: 92  Temp: 98.1 F (36.7 C)  Resp: 20       Yohannes Waibel A Eulala Newcombe, PA-C 07/09/14 2328  Tomasita CrumbleAdeleke Oni, MD 07/10/14 1504

## 2014-07-07 NOTE — ED Notes (Signed)
No difficulty breathing no distress of any kind

## 2014-07-21 ENCOUNTER — Telehealth: Payer: Self-pay | Admitting: Family Medicine

## 2014-07-21 NOTE — Telephone Encounter (Signed)
Pt called back and would like an appointment to [facility]. Myriam Jacobsonjw

## 2014-07-22 NOTE — Telephone Encounter (Signed)
Will work on this referral.  Burnard HawthorneJazmin Hartsell,CMA

## 2014-11-25 ENCOUNTER — Emergency Department (HOSPITAL_COMMUNITY)
Admission: EM | Admit: 2014-11-25 | Discharge: 2014-11-25 | Disposition: A | Discharge status: Home / Self Care | Payer: Medicaid Other | Attending: Emergency Medicine | Admitting: Emergency Medicine

## 2014-11-25 ENCOUNTER — Encounter (HOSPITAL_COMMUNITY): Payer: Self-pay | Admitting: *Deleted

## 2014-11-25 DIAGNOSIS — Z79899 Other long term (current) drug therapy: Secondary | ICD-10-CM | POA: Insufficient documentation

## 2014-11-25 DIAGNOSIS — E669 Obesity, unspecified: Secondary | ICD-10-CM | POA: Diagnosis not present

## 2014-11-25 DIAGNOSIS — Z3202 Encounter for pregnancy test, result negative: Secondary | ICD-10-CM | POA: Diagnosis not present

## 2014-11-25 DIAGNOSIS — B349 Viral infection, unspecified: Secondary | ICD-10-CM

## 2014-11-25 DIAGNOSIS — Z792 Long term (current) use of antibiotics: Secondary | ICD-10-CM | POA: Diagnosis not present

## 2014-11-25 DIAGNOSIS — R000 Tachycardia, unspecified: Secondary | ICD-10-CM | POA: Insufficient documentation

## 2014-11-25 DIAGNOSIS — R51 Headache: Secondary | ICD-10-CM | POA: Diagnosis present

## 2014-11-25 HISTORY — DX: Obesity, unspecified: E66.9

## 2014-11-25 LAB — URINALYSIS W MICROSCOPIC (NOT AT ARMC)
BILIRUBIN URINE: NEGATIVE
Glucose, UA: NEGATIVE mg/dL
Hgb urine dipstick: NEGATIVE
KETONES UR: NEGATIVE mg/dL
Nitrite: NEGATIVE
PROTEIN: NEGATIVE mg/dL
Specific Gravity, Urine: 1.021 (ref 1.005–1.030)
Urobilinogen, UA: 1 mg/dL (ref 0.0–1.0)
pH: 7 (ref 5.0–8.0)

## 2014-11-25 LAB — BASIC METABOLIC PANEL
Anion gap: 3 — ABNORMAL LOW (ref 5–15)
BUN: 7 mg/dL (ref 6–23)
CALCIUM: 9.1 mg/dL (ref 8.4–10.5)
CO2: 28 mmol/L (ref 19–32)
Chloride: 105 mmol/L (ref 96–112)
Creatinine, Ser: 0.78 mg/dL (ref 0.50–1.10)
GLUCOSE: 94 mg/dL (ref 70–99)
POTASSIUM: 3.6 mmol/L (ref 3.5–5.1)
Sodium: 136 mmol/L (ref 135–145)

## 2014-11-25 LAB — CBC
HCT: 36.2 % (ref 36.0–46.0)
HEMOGLOBIN: 12 g/dL (ref 12.0–15.0)
MCH: 29.2 pg (ref 26.0–34.0)
MCHC: 33.1 g/dL (ref 30.0–36.0)
MCV: 88.1 fL (ref 78.0–100.0)
PLATELETS: 246 10*3/uL (ref 150–400)
RBC: 4.11 MIL/uL (ref 3.87–5.11)
RDW: 13.6 % (ref 11.5–15.5)
WBC: 4.6 10*3/uL (ref 4.0–10.5)

## 2014-11-25 LAB — PREGNANCY, URINE: PREG TEST UR: NEGATIVE

## 2014-11-25 LAB — RAPID STREP SCREEN (MED CTR MEBANE ONLY): Streptococcus, Group A Screen (Direct): NEGATIVE

## 2014-11-25 MED ORDER — SODIUM CHLORIDE 0.9 % IV BOLUS (SEPSIS)
1000.0000 mL | Freq: Once | INTRAVENOUS | Status: AC
  Administered 2014-11-25: 1000 mL via INTRAVENOUS

## 2014-11-25 MED ORDER — KETOROLAC TROMETHAMINE 30 MG/ML IJ SOLN
30.0000 mg | Freq: Once | INTRAMUSCULAR | Status: AC
  Administered 2014-11-25: 30 mg via INTRAVENOUS
  Filled 2014-11-25: qty 1

## 2014-11-25 NOTE — ED Notes (Signed)
Pt thinks she has the flu, has headache, nausea and chills since this am.

## 2014-11-25 NOTE — ED Provider Notes (Signed)
CSN: [id number]     Arrival date & time 11/25/14  1058 History   First MD Initiated Contact with Patient 11/25/14 1110     Chief Complaint  Patient presents with  . Nausea  . Headache     (Consider location/radiation/quality/duration/timing/severity/associated sxs/prior Treatment) HPI  Pt presenting with /co body aches, headache, chills, feeling lightheaded.  No vomiting.  No abdominal or chest pain.  No cough or difficulty breathing.  Has had some nasal congestion and sore throat.  No rash.  No diarrhea or change in stools.  Did not receive flu shot this year.  No specific sick contacts.  Has not had any treatment prior to arrival.  There are no other associated systemic symptoms, there are no other alleviating or modifying factors.   Past Medical History  Diagnosis Date  . Obesity    History reviewed. No pertinent past surgical history. Family History  Problem Relation Age of Onset  . Alcohol abuse Mother   . Depression Mother    History  Substance Use Topics  . Smoking status: Never Smoker   . Smokeless tobacco: Not on file  . Alcohol Use: No   OB History    No data available     Review of Systems  ROS reviewed and all otherwise negative except for mentioned in HPI    Allergies  Review of patient's allergies indicates no known allergies.  Home Medications   Prior to Admission medications   Medication Sig Start Date End Date Taking? Authorizing Provider  clindamycin (CLINDAGEL) 1 % gel Apply 1 application topically 2 (two) times daily. 10/02/14  Yes Historical Provider, MD  diphenhydrAMINE (BENADRYL) 25 MG tablet Take 1 tablet (25 mg total) by mouth every 6 (six) hours. 07/07/14  Yes Tatyana Kirichenko, PA-C  doxycycline (VIBRAMYCIN) 100 MG capsule Take 1 capsule (100 mg total) by mouth 2 (two) times daily. One po bid x 7 days 06/14/14  Yes Trixie Dredge, PA-C  etonogestrel (NEXPLANON) 68 MG IMPL implant Inject 1 each into the skin once. Implanted November 28, 2012   Yes  Historical Provider, MD  famotidine (PEPCID) 20 MG tablet Take 1 tablet (20 mg total) by mouth 2 (two) times daily. Patient not taking: Reported on 11/25/2014 07/07/14   Tatyana Kirichenko, PA-C  ibuprofen (ADVIL,MOTRIN) 800 MG tablet Take 1 tablet (800 mg total) by mouth every 8 (eight) hours as needed for mild pain or moderate pain. Patient not taking: Reported on 11/25/2014 06/14/14   Trixie Dredge, PA-C  predniSONE (DELTASONE) 10 MG tablet Take 5 tab day 1, take 4 tab day 2, take 3 tab day 3, take 2 tab day 4, and take 1 tab day 5 Patient not taking: Reported on 11/25/2014 07/07/14   Tatyana Kirichenko, PA-C   BP 117/61 mmHg  Pulse 98  Temp(Src) 98.2 F (36.8 C) (Oral)  Resp 16  Ht  (1.753 m)  Wt 294 lb (133.358 kg)  BMI 43.40 kg/m2  SpO2 100%  Vitals reviewed Physical Exam  Physical Examination: General appearance - alert, well appearing, and in no distress Mental status - alert, oriented to person, place, and time Eyes - no conjunctival injection, no scleral icterus Mouth - mucous membranes moist, pharynx normal without lesions  Neck- no nuchal rigidity or meningismus, no sig LAD Chest - clear to auscultation, no wheezes, rales or rhonchi, symmetric air entry Heart - normal rate, regular rhythm, normal S1, S2, no murmurs, rubs, clicks or gallops Abdomen - soft, nontender, nondistended, no masses or organomegaly,  nabs Extremities - peripheral pulses normal, no pedal edema, no clubbing or cyanosis Skin - normal coloration and turgor, no rashes  ED Course  Procedures (including critical care time) Labs Review Labs Reviewed  CULTURE, GROUP A STREP - Abnormal; Notable for the following:    Strep A Culture Comment (*)    All other components within normal limits  URINALYSIS W MICROSCOPIC - Abnormal; Notable for the following:    Leukocytes, UA SMALL (*)    Bacteria, UA FEW (*)    Squamous Epithelial / LPF FEW (*)    All other components within normal limits  BASIC METABOLIC  PANEL - Abnormal; Notable for the following:    Anion gap 3 (*)    All other components within normal limits  RAPID STREP SCREEN  PREGNANCY, URINE  CBC    Imaging Review No results found.   EKG Interpretation None      MDM   Final diagnoses:  Viral infection  Tachycardia    Pt presenting with c/o chills, nausea, headache.  No meningismus.  Pt tachycardic up on arrival.  Workup reassuring, pt treated with IV meds and fluids.  Pt feeling improved.  Suspect viral infection/flu like illness.  Discharged with strict return precautions.  Pt agreeable with plan.    Jerelyn ScottMartha Linker, MD 11/27/14 1409

## 2014-11-25 NOTE — Discharge Instructions (Signed)
Return to the ED with any concerns including difficulty breathing, vomiting and not able to keep down liquids, fainting, decreased level of alertness/lethargy, or any other alarming symptoms °

## 2014-11-25 NOTE — ED Notes (Signed)
Pt is in stable condition upon d/c and ambulates from ED. 

## 2014-11-27 LAB — CULTURE, GROUP A STREP

## 2015-03-12 ENCOUNTER — Ambulatory Visit (INDEPENDENT_AMBULATORY_CARE_PROVIDER_SITE_OTHER): Payer: Medicaid Other | Admitting: Family Medicine

## 2015-03-12 ENCOUNTER — Encounter: Payer: Self-pay | Admitting: Family Medicine

## 2015-03-12 DIAGNOSIS — Z0000 Encounter for general adult medical examination without abnormal findings: Secondary | ICD-10-CM

## 2015-03-12 NOTE — Patient Instructions (Signed)
  Starchy (carb) foods include: Bread, rice, pasta, potatoes, corn, crackers, bagels, muffins, all baked goods.  (Fruits, milk, and yogurt also have carbohydrate, but most of these foods will not spike your blood sugar as the starchy foods will.)  A few fruits do cause high blood sugars; use small portions of bananas (limit to 1/2 at a time), grapes, and tropical fruits.    Protein foods include: Meat, fish, poultry, eggs, dairy foods, and beans such as pinto and kidney beans (beans also provide carbohydrate).   1. Eat at least 3 meals and 1-2 snacks per day. Never go more than 4-5 hours while awake without eating.  2. Limit starchy foods to TWO per meal and ONE per snack. ONE portion of a starchy  food is equal to the following:   - ONE slice of bread (or its equivalent, such as half of a hamburger bun).   - 1/2 cup of a "scoopable" starchy food such as potatoes or rice.   - 15 grams of carbohydrate as shown on food label.  3. Both lunch and dinner should include a protein food, a carb food, and vegetables.   - Obtain twice as many veg's as protein or carbohydrate foods for both lunch and dinner.   - Fresh or frozen veg's are best.   - Try to keep frozen veg's on hand for a quick vegetable serving.    4. Breakfast should always include protein.

## 2015-03-16 NOTE — Progress Notes (Signed)
   Subjective:    Patient ID: Stacey Hendrix, female    DOB: 10-Nov-1995, 19 y.o.   MRN: [id number]  HPI  CC: annual physical  # Annual physical:  Non-smoker  Sexually active (single partner, broke up about 3 months ago and has not been active since that time)  # Severe obesity  Concerned her nexplanon has caused her to gain a lot of weight  Getting exercise 2 times a week, for about 1 hour in a gym  Cut out sodas but still drinking sugary drinks = gatorade daily, juices  Review of Systems   See HPI for ROS. 12 point ROS sheet filled out, reviewed and is negative.  Past medical history, surgical, family, and social history reviewed and updated in the EMR as appropriate. Objective:  BP 123/67 mmHg  Pulse 99  Temp(Src) 98.1 F (36.7 C) (Oral)  Ht 4' 10.75" (1.492 m)  Wt 307 lb (139.254 kg)  BMI 62.56 kg/m2 Vitals and nursing note reviewed  General: NAD CV: RRR, nl s1s2 no mrg Resp: CTAB nl effort Ext: normal muscle bulk/tone Neuro: alert and oriented, no focal deficits Psych: mood normal, affect congruent. Normal speech and thought content.  Assessment & Plan:  See Problem List Documentation

## 2015-03-16 NOTE — Assessment & Plan Note (Signed)
Discussed nutrition and exercise management steps. Encouraged to keep food diary, can use [employer]itness[employer]. Pt interested in nutritional therapy (had previously put in this referral 1 year ago but she did not schedule this). Again placed MNT referral, given Dr. Gerilyn PilgrimSykes card and instructed to call for appt.

## 2015-05-26 ENCOUNTER — Ambulatory Visit: Payer: Medicaid Other | Admitting: Family Medicine

## 2015-09-15 ENCOUNTER — Emergency Department (HOSPITAL_COMMUNITY)
Admission: EM | Admit: 2015-09-15 | Discharge: 2015-09-15 | Disposition: A | Discharge status: Home / Self Care | Payer: Medicaid Other | Attending: Emergency Medicine | Admitting: Emergency Medicine

## 2015-09-15 ENCOUNTER — Encounter (HOSPITAL_COMMUNITY): Payer: Self-pay

## 2015-09-15 DIAGNOSIS — R21 Rash and other nonspecific skin eruption: Secondary | ICD-10-CM | POA: Insufficient documentation

## 2015-09-15 DIAGNOSIS — Z79899 Other long term (current) drug therapy: Secondary | ICD-10-CM | POA: Insufficient documentation

## 2015-09-15 DIAGNOSIS — E669 Obesity, unspecified: Secondary | ICD-10-CM | POA: Insufficient documentation

## 2015-09-15 DIAGNOSIS — H6091 Unspecified otitis externa, right ear: Secondary | ICD-10-CM | POA: Insufficient documentation

## 2015-09-15 MED ORDER — CIPROFLOXACIN-DEXAMETHASONE 0.3-0.1 % OT SUSP
4.0000 [drp] | Freq: Two times a day (BID) | OTIC | Status: AC
  Administered 2015-09-15: 4 [drp] via OTIC
  Filled 2015-09-15: qty 7.5

## 2015-09-15 MED ORDER — HYDROCORTISONE 2.5 % EX LOTN: TOPICAL_LOTION | Freq: Two times a day (BID) | CUTANEOUS | Status: AC

## 2015-09-15 NOTE — Discharge Instructions (Signed)
Please use Ciprodex drops twice a day with 4 drops in ear right ear for 10 days.  Otitis Externa Otitis externa is a bacterial or fungal infection of the outer ear canal. This is the area from the eardrum to the outside of the ear. Otitis externa is sometimes called "swimmer's ear." CAUSES  Possible causes of infection include:  Swimming in dirty water.  Moisture remaining in the ear after swimming or bathing.  Mild injury (trauma) to the ear.  Objects stuck in the ear (foreign body).  Cuts or scrapes (abrasions) on the outside of the ear. SIGNS AND SYMPTOMS  The first symptom of infection is often itching in the ear canal. Later signs and symptoms may include swelling and redness of the ear canal, ear pain, and yellowish-white fluid (pus) coming from the ear. The ear pain may be worse when pulling on the earlobe. DIAGNOSIS  Your health care provider will perform a physical exam. A sample of fluid may be taken from the ear and examined for bacteria or fungi. TREATMENT  Antibiotic ear drops are often given for 10 to 14 days. Treatment may also include pain medicine or corticosteroids to reduce itching and swelling. HOME CARE INSTRUCTIONS   Apply antibiotic ear drops to the ear canal as prescribed by your health care provider.  Take medicines only as directed by your health care provider.  If you have diabetes, follow any additional treatment instructions from your health care provider.  Keep all follow-up visits as directed by your health care provider. PREVENTION   Keep your ear dry. Use the corner of a towel to absorb water out of the ear canal after swimming or bathing.  Avoid scratching or putting objects inside your ear. This can damage the ear canal or remove the protective wax that lines the canal. This makes it easier for bacteria and fungi to grow.  Avoid swimming in lakes, polluted water, or poorly chlorinated pools.  You may use ear drops made of rubbing alcohol and  vinegar after swimming. Combine equal parts of white vinegar and alcohol in a bottle. Put 3 or 4 drops into each ear after swimming. SEEK MEDICAL CARE IF:   You have a fever.  Your ear is still red, swollen, painful, or draining pus after 3 days.  Your redness, swelling, or pain gets worse.  You have a severe headache.  You have redness, swelling, pain, or tenderness in the area behind your ear. MAKE SURE YOU:   Understand these instructions.  Will watch your condition.  Will get help right away if you are not doing well or get worse.   This information is not intended to replace advice given to you by your health care provider. Make sure you discuss any questions you have with your health care provider.   Document Released: 08/15/2005 Document Revised: 09/05/2014 Document Reviewed: 09/01/2011 Elsevier Interactive Patient Education 2016 [employer]. Ciprofloxacin; Dexamethasone ear suspension What is this medicine? CIPROFLOXACIN; DEXAMETHASONE (sip roe FLOX a sin; dex a METH a sone) is used to treat ear infections. It also stops the swelling and itching caused by the infection. This medicine may be used for other purposes; ask your health care provider or pharmacist if you have questions. What should I tell my health care provider before I take this medicine? They need to know if you have any of these conditions: -any other active infection -viral ear infection -an unusual or allergic reaction to ciprofloxacin; dexamethasone, other medicines, foods, dyes, or preservatives -pregnant or  trying to get pregnant -breast-feeding How should I use this medicine? This medicine is only for use in the ears. Wash your hands with soap and water. Do not insert any object or swab into the ear canal. Gently warm the bottle by holding it in the hand for 1 to 2 minutes. Shake the bottle immediately before using. Lie down on your side with the affected ear up. Try not to touch the tip of the  dropper to your ear, fingertips, or other surface. Squeeze the bottle gently to put the prescribed number of drops in the ear canal. Stay in this position for 30 to 60 seconds to help the drops soak into the ear. Repeat the steps for the other ear if both ears are infected. Do not use your medicine more often than directed. Finish the full course of medicine prescribed by your doctor or health care professional even if you think your condition is better. Talk to your pediatrician regarding the use of this medicine in children. While this drug may be prescribed for children as young as in children 21 months of age and older for selected conditions, precautions do apply. Overdosage: If you think you have taken too much of this medicine contact a poison control center or emergency room at once. NOTE: This medicine is only for you. Do not share this medicine with others. What if I miss a dose? If you miss a dose, use it as soon as you can. If it is almost time for your next dose, use only that dose. Do not use double or extra doses. What may interact with this medicine? Interactions are not expected. Do not use any other ear products without telling your doctor or health care professional. This list may not describe all possible interactions. Give your health care provider a list of all the medicines, herbs, non-prescription drugs, or dietary supplements you use. Also tell them if you smoke, drink alcohol, or use illegal drugs. Some items may interact with your medicine. What should I watch for while using this medicine? Tell your doctor or health care professional if your ear infection does not get better in a few days. If rash or allergic reaction occurs, stop using immediately and contact your doctor or health care professional. It is important that you keep the infected ear(s) clean and dry. When bathing, try not to get the infected ear(s) wet. Do not go swimming unless your doctor or health care  professional has told you otherwise. To prevent the spread of infection, do not share ear products or share towels and washcloths with anyone else. What side effects may I notice from receiving this medicine? Side effects that you should report to your doctor or health care professional as soon as possible: -allergic reactions like skin rash, itching or hives, swelling of the face, lips, or tongue -burning, itching, and redness -worsening ear pain Side effects that usually do not require medical attention (report to your doctor or health care professional if they continue or are bothersome): -abnormal feeling in the ear -headache -unpleasant feeling while putting the drops in the ear This list may not describe all possible side effects. Call your doctor for medical advice about side effects. You may report side effects to FDA at 1-800-FDA-1088. Where should I keep my medicine? Keep out of the reach of children. Store at room temperature between 15 and 30 degrees C (59 and 86 degrees F). Do not freeze. Protect from light. Throw away any unused medicine after the  expiration date. NOTE: This sheet is a summary. It may not cover all possible information. If you have questions about this medicine, talk to your doctor, pharmacist, or health care provider.    2016, Elsevier/Gold Standard. (2007-10-31 10:33:06) Rash A rash is a change in the color or texture of the skin. There are many different types of rashes. You may have other problems that accompany your rash. CAUSES   Infections.  Allergic reactions. This can include allergies to pets or foods.  Certain medicines.  Exposure to certain chemicals, soaps, or cosmetics.  Heat.  Exposure to poisonous plants.  Tumors, both cancerous and noncancerous. SYMPTOMS   Redness.  Scaly skin.  Itchy skin.  Dry or cracked skin.  Bumps.  Blisters.  Pain. DIAGNOSIS  Your caregiver may do a physical exam to determine what type of rash  you have. A skin sample (biopsy) may be taken and examined under a microscope. TREATMENT  Treatment depends on the type of rash you have. Your caregiver may prescribe certain medicines. For serious conditions, you may need to see a skin doctor (dermatologist). HOME CARE INSTRUCTIONS   Avoid the substance that caused your rash.  Do not scratch your rash. This can cause infection.  You may take cool baths to help stop itching.  Only take over-the-counter or prescription medicines as directed by your caregiver.  Keep all follow-up appointments as directed by your caregiver. SEEK IMMEDIATE MEDICAL CARE IF:  You have increasing pain, swelling, or redness.  You have a fever.  You have new or severe symptoms.  You have body aches, diarrhea, or vomiting.  Your rash is not better after 3 days. MAKE SURE YOU:  Understand these instructions.  Will watch your condition.  Will get help right away if you are not doing well or get worse.   This information is not intended to replace advice given to you by your health care provider. Make sure you discuss any questions you have with your health care provider.   Document Released: 08/05/2002 Document Revised: 09/05/2014 Document Reviewed: 12/31/2014 Elsevier Interactive Patient Education [employer]

## 2015-09-15 NOTE — ED Notes (Signed)
Patient here with right ear pain and right breast redness x 2 days, no hardness or pain to breast noted.

## 2015-09-15 NOTE — ED Provider Notes (Signed)
CSN: [id number]     Arrival date & time 09/15/15  1059 History  By signing my name below, I, Freida Busman, attest that this documentation has been prepared under the direction and in the presence of non-physician practitioner, Everlene Farrier, PA-C. Electronically Signed: Freida Busman, Scribe. 09/15/2015. 12:58 PM.    Chief Complaint  Patient presents with  . Otalgia  . breast redness     The history is provided by the patient. No language interpreter was used.    HPI Comments:  Stacey Hendrix is a 20 y.o. female who presents to the Emergency Department complaining of 7/10 right ear pain x 3 days.  Pt reports h/o ear infection, her last was ~ 4 years ago. She deneis tinnitus, drainage from the ear, sneezing, fever, and rhinorrhea. No alleviating factors noted.  Pt also complains of an area of pruritus and redness to her right breast, that has gradually worsened since onset yesterday. She denies changes in soaps, lotions, and detergents. She has applied hydrocortisone cream without relief.    Past Medical History  Diagnosis Date  . Obesity    History reviewed. No pertinent past surgical history. Family History  Problem Relation Age of Onset  . Alcohol abuse Mother   . Depression Mother    Social History  Substance Use Topics  . Smoking status: Never Smoker   . Smokeless tobacco: None  . Alcohol Use: No   OB History    No data available     Review of Systems  Constitutional: Negative for fever and chills.  HENT: Positive for ear pain. Negative for ear discharge and tinnitus.   Respiratory: Negative for shortness of breath.   Cardiovascular: Negative for chest pain.  Skin: Positive for color change and rash.       Right breast     Allergies  Review of patient's allergies indicates no known allergies.  Home Medications   Prior to Admission medications   Medication Sig Start Date End Date Taking? Authorizing Provider  diphenhydrAMINE (BENADRYL) 25 MG tablet Take 1  tablet (25 mg total) by mouth every 6 (six) hours. 07/07/14   Tatyana Kirichenko, PA-C  etonogestrel (NEXPLANON) 68 MG IMPL implant Inject 1 each into the skin once. Implanted November 28, 2012    Historical Provider, MD  famotidine (PEPCID) 20 MG tablet Take 1 tablet (20 mg total) by mouth 2 (two) times daily. Patient not taking: Reported on 11/25/2014 07/07/14   Jaynie Crumble, PA-C  hydrocortisone 2.5 % lotion Apply topically 2 (two) times daily. 09/15/15   Everlene Farrier, PA-C  ibuprofen (ADVIL,MOTRIN) 800 MG tablet Take 1 tablet (800 mg total) by mouth every 8 (eight) hours as needed for mild pain or moderate pain. Patient not taking: Reported on 11/25/2014 06/14/14   Trixie Dredge, PA-C   BP 105/58 mmHg  Pulse 83  Temp(Src) 98.1 F (36.7 C) (Oral)  Resp 18  Ht  (1.778 m)  Wt 315 lb (142.883 kg)  BMI 45.20 kg/m2  SpO2 100% Physical Exam  Constitutional: She appears well-developed and well-nourished. No distress.  Nontoxic appearing.  HENT:  Head: Normocephalic and atraumatic.  Right Ear: Tympanic membrane normal.  Left Ear: External ear normal.  Mouth/Throat: Oropharynx is clear and moist. No oropharyngeal exudate.  External auditory canal edema noted on the right with white debris in EAC Left TM partially visualized; appears intact and pearly gray Right TM normal.     Eyes: Conjunctivae are normal. Pupils are equal, round, and reactive to light.  Right eye exhibits no discharge. Left eye exhibits no discharge.  Neck: Normal range of motion. Neck supple. No JVD present. No tracheal deviation present.  Cardiovascular: Normal rate, regular rhythm, normal heart sounds and intact distal pulses.   Pulmonary/Chest: Effort normal and breath sounds normal. No respiratory distress. She has no wheezes. She has no rales.  Abdominal: There is no tenderness.  Lymphadenopathy:    She has no cervical adenopathy.  Neurological: She is alert. Coordination normal.  Skin: Skin is warm and dry.  Rash noted. She is not diaphoretic. No pallor.  3 cm area of erythema noted to right breat with no TTP;  no vesicle/papules noted, no induration or fluctuance.  Chaperone (scribe) was present for breast exam which was performed with no discomfort or complications.    Psychiatric: She has a normal mood and affect. Her behavior is normal.  Nursing note and vitals reviewed.   ED Course  Procedures   DIAGNOSTIC STUDIES:  Oxygen Saturation is 100% on RA, normal by my interpretation.    COORDINATION OF CARE:  12:52 PM Will insert ear wick in ED for ear drops and discharge with steroid cream for rash.   Discussed treatment plan with pt at bedside and pt agreed to plan.    MDM   Meds given in ED:  Medications  ciprofloxacin-dexamethasone (CIPRODEX) 0.3-0.1 % otic suspension 4 drop (4 drops Right Ear Given 09/15/15 1327)    Discharge Medication List as of 09/15/2015 12:58 PM    START taking these medications   Details  hydrocortisone 2.5 % lotion Apply topically 2 (two) times daily., Starting 09/15/2015, Until Discontinued, Print        Final diagnoses:  Otitis externa, right  Rash and nonspecific skin eruption   Patient presents complaining of right ear pain. On exam patient has evidence of otitis externa. She has external auditory canal edema with debris in her EAC. Right TM partially visualized and appears intact. Patient given earwick and Ciprodex drops. I advised to use 4 drops in her right ear with Ciprodex twice a day. I encouraged her to follow-up with ENT Dr. Annalee Genta.  Patient also reports an itching area of redness to her right breast. On exam patient has a 3 cm area of erythema with no induration or discharge. No evidence of abscess. It appears to be rash. No tenderness to palpation. No vesicles or warmth. Will start the patient on hydrocortisone cream and have her follow-up with primary care. I advised the patient to follow-up with their primary care provider this week. I  advised the patient to return to the emergency department with new or worsening symptoms or new concerns. The patient verbalized understanding and agreement with plan.    I personally performed the services described in this documentation, which was scribed in my presence. The recorded information has been reviewed and is accurate.       Everlene Farrier, PA-C 09/15/15 1354  Mancel Bale, MD 09/15/15 [phone]

## 2015-09-17 ENCOUNTER — Ambulatory Visit: Payer: Medicaid Other | Admitting: Family Medicine

## 2015-12-02 ENCOUNTER — Ambulatory Visit: Payer: Self-pay | Admitting: Family Medicine

## 2015-12-24 ENCOUNTER — Ambulatory Visit: Payer: Self-pay | Admitting: Family Medicine

## 2016-06-16 ENCOUNTER — Ambulatory Visit: Payer: Medicaid Other

## 2016-07-06 NOTE — Progress Notes (Signed)
   [facility] Phone: [phone]   Date of Visit: 07/07/2016   HPI: Here for Nexplanon Removal  - Implanted 11/28/2012 - no issues with Nexplanon  - does not want this replaced today - would like be off contraceptives for a little while. She is not interested in becoming pregnant. She reports that she will use condoms for contraception. Thinks she will eventually return for Nexplanon reinsertion.  - no history of easy bruising or bleeding    ROS: See HPI.  PMFSH:  PMH:  Allergic Rhinitis Hidradenitis Obesity  PHYSICAL EXAM: BP (!) 121/49   Pulse 83   Temp 97.8 F (36.6 C) (Oral)   Ht 5\' 10"  (1.778 m)   Wt (!) 328 lb 3.2 oz (148.9 kg)   BMI 47.09 kg/m   PROCEDURE NOTE: Implanon Removal Patient given informed consent for removal of her implanon.  Signed copy in the chart.  Appropriate time out taken.   Implanon site identified.  Area prepped in usual sterile fashon. One cc of 1% lidocaine was used to anesthetize the area at the distal end of the implant. A small stab incision was made right beside the implant on the distal portion. The implanon rod was grasped using hemostats and removed without difficulty.  There was less than 3 cc blood loss. There were no complications.  Steri-strips were applied over the small incision.  A pressure bandage was applied to reduce any bruising.  The patient tolerated the procedure well and was given post procedure instructions.   ASSESSMENT/PLAN:  Health maintenance:  -declines flu and tetanus today.   Implanon Removal: patient declines re-insertion  FOLLOW UP: Follow up ASAP for well woman exam   Palma HolterKanishka G Council Munguia, MD PGY 2 [facility]

## 2016-07-07 ENCOUNTER — Encounter: Payer: Self-pay | Admitting: Internal Medicine

## 2016-07-07 ENCOUNTER — Ambulatory Visit (INDEPENDENT_AMBULATORY_CARE_PROVIDER_SITE_OTHER): Payer: Medicaid Other | Admitting: Internal Medicine

## 2016-07-07 VITALS — BP 121/49 | HR 83 | Temp 97.8°F | Ht 70.0 in | Wt 328.2 lb

## 2016-07-07 DIAGNOSIS — Z3046 Encounter for surveillance of implantable subdermal contraceptive: Secondary | ICD-10-CM | POA: Diagnosis not present

## 2016-07-07 NOTE — Patient Instructions (Signed)
You can remove with large bandage in 24 hours. You can remove the band-aid in 3-5 days. Some swelling/slight bleeding around the site is normal in the next few days. If there is worsening redness, drainage from the site please return to clinic.   Please make an appointment for your well woman exam soon.

## 2016-08-10 ENCOUNTER — Emergency Department (HOSPITAL_COMMUNITY)
Admission: EM | Admit: 2016-08-10 | Discharge: 2016-08-10 | Disposition: A | Discharge status: Home / Self Care | Payer: Medicaid Other | Attending: Dermatology | Admitting: Dermatology

## 2016-08-10 ENCOUNTER — Encounter (HOSPITAL_COMMUNITY): Payer: Self-pay | Admitting: *Deleted

## 2016-08-10 DIAGNOSIS — R21 Rash and other nonspecific skin eruption: Secondary | ICD-10-CM | POA: Insufficient documentation

## 2016-08-10 DIAGNOSIS — Z5321 Procedure and treatment not carried out due to patient leaving prior to being seen by health care provider: Secondary | ICD-10-CM | POA: Insufficient documentation

## 2016-08-10 NOTE — ED Triage Notes (Signed)
Pt c/o generalized rash x 3 weeks, denies new detergents, soaps, or lotions. No visible rash at this time, reddened area to L chest. Has been taking benadryl and hydrocortisone without relief

## 2016-08-10 NOTE — ED Notes (Signed)
Called for patient in subwaiting and regular waiting room.  No answer.

## 2016-08-10 NOTE — ED Notes (Signed)
Called for patient in main lobby, as well as in sub-waiting. No answer.

## 2016-08-10 NOTE — ED Triage Notes (Signed)
Called without response 

## 2016-08-11 ENCOUNTER — Ambulatory Visit (HOSPITAL_COMMUNITY)
Admission: EM | Admit: 2016-08-11 | Discharge: 2016-08-11 | Disposition: A | Discharge status: Home / Self Care | Payer: Medicaid Other | Attending: Emergency Medicine | Admitting: Emergency Medicine

## 2016-08-11 ENCOUNTER — Encounter (HOSPITAL_COMMUNITY): Payer: Self-pay | Admitting: Emergency Medicine

## 2016-08-11 DIAGNOSIS — L509 Urticaria, unspecified: Secondary | ICD-10-CM

## 2016-08-11 DIAGNOSIS — L299 Pruritus, unspecified: Secondary | ICD-10-CM

## 2016-08-11 MED ORDER — METHYLPREDNISOLONE 4 MG PO TBPK: ORAL_TABLET | ORAL | 0 refills | Status: DC

## 2016-08-11 NOTE — ED Triage Notes (Signed)
Here for rash onset 3 weeks... Last week has gotten worse  Rash all over body... Denies new foods/hygiene products/meds  Denies fevers  Taking diphenhydramine w/temp relief.   A&O x4... NAD

## 2016-08-11 NOTE — ED Provider Notes (Signed)
CSN: [id number]     Arrival date & time 08/11/16  1304 History   First MD Initiated Contact with Patient 08/11/16 1419     Chief Complaint  Patient presents with  . Rash   (Consider location/radiation/quality/duration/timing/severity/associated sxs/prior Treatment) 20 year old female complaining of itchiness all over. Sometimes she will see a red rash, some places appear light needle.other areas appear like a macular erythema. Started approximately 3 weeks ago. It is about that time in which she had her first smoke marijuana. She is unaware of any other food, draining, drug or contact material that may have caused an allergic reaction. Denies problems with breathing, swelling, cough. She has taken Benadryl and other nonsedating antihistamines with modest to no relief.      Past Medical History:  Diagnosis Date  . Obesity    History reviewed. No pertinent surgical history. Family History  Problem Relation Age of Onset  . Alcohol abuse Mother   . Depression Mother    Social History  Substance Use Topics  . Smoking status: Never Smoker  . Smokeless tobacco: Never Used  . Alcohol use No   OB History    No data available     Review of Systems  Constitutional: Negative.  Negative for fever.  HENT: Negative.   Eyes: Negative.   Respiratory: Negative for cough, shortness of breath and wheezing.   Cardiovascular: Negative for chest pain.  Gastrointestinal: Negative.   Skin:       As per history of present illness  Neurological: Negative.     Allergies  Patient has no known allergies.  Home Medications   Prior to Admission medications   Medication Sig Start Date End Date Taking? Authorizing Provider  diphenhydrAMINE (BENADRYL) 25 MG tablet Take 1 tablet (25 mg total) by mouth every 6 (six) hours. 07/07/14   Tatyana Kirichenko, PA-C  famotidine (PEPCID) 20 MG tablet Take 1 tablet (20 mg total) by mouth 2 (two) times daily. Patient not taking: Reported on 08/11/2016  07/07/14   Tatyana Kirichenko, PA-C  hydrocortisone 2.5 % lotion Apply topically 2 (two) times daily. 09/15/15   Everlene FarrierWilliam Dansie, PA-C  ibuprofen (ADVIL,MOTRIN) 800 MG tablet Take 1 tablet (800 mg total) by mouth every 8 (eight) hours as needed for mild pain or moderate pain. Patient not taking: Reported on 08/11/2016 06/14/14   Trixie DredgeEmily West, PA-C  methylPREDNISolone (MEDROL DOSEPAK) 4 MG TBPK tablet follow package directions 08/11/16   Hayden Rasmussenavid Iliana Hutt, NP   Meds Ordered and Administered this Visit  Medications - No data to display  BP (!) 105/51 (BP Location: Left Arm)   Pulse 81   Temp 97.7 F (36.5 C) (Oral)   Resp 18   LMP 05/15/2016 (Approximate)   SpO2 100%  No data found.   Physical Exam  Constitutional: She is oriented to person, place, and time. She appears well-developed and well-nourished. No distress.  HENT:  Head: Atraumatic.  Mouth/Throat: Oropharynx is clear and moist.  Eyes: EOM are normal.  Neck: Neck supple.  Cardiovascular: Normal rate, regular rhythm and normal heart sounds.   Pulmonary/Chest: Effort normal and breath sounds normal. She has no wheezes. She has no rales.  Musculoskeletal: She exhibits no edema.  Neurological: She is alert and oriented to person, place, and time.  Skin: Skin is warm and dry.  Currently no visible rashes seen.  Nursing note and vitals reviewed.   Urgent Care Course   Clinical Course     Procedures (including critical care time)  Labs Review Labs Reviewed -  No data to display  Imaging Review No results found.   Visual Acuity Review  Right Eye Distance:   Left Eye Distance:   Bilateral Distance:    Right Eye Near:   Left Eye Near:    Bilateral Near:         MDM   1. Hives   2. Itching    Take the medication as directed. Continue taken the cetirizine daily as well. Avoid any known causative agents. If this is persistent you may want to follow up with primary care doctor. It is likely urticaria or hives due to an  allergic reaction. Meds ordered this encounter  Medications  . methylPREDNISolone (MEDROL DOSEPAK) 4 MG TBPK tablet    Sig: follow package directions    Dispense:  21 tablet    Refill:  0    Order Specific Question:   Supervising Provider    Answer:   Micheline ChapmanHONIG, ERIN J [[id number]]       Hayden Rasmussenavid Judithann Villamar, NP 08/11/16 1439

## 2016-08-11 NOTE — Discharge Instructions (Signed)
Take the medication as directed. Continue taken the cetirizine daily as well. Avoid any known causative agents. If this is persistent you may want to follow up with primary care doctor. It is likely urticaria or hives due to an allergic reaction.

## 2016-08-18 NOTE — Progress Notes (Signed)
Stacey Hendrix Phone: [phone]   Date of Visit: 08/19/2016   HPI:  Patient presents today for a well woman exam.   She recently went to Urgent Care on 08/11/16 Was itching all over: benadryl and zyrtec. OTC antibacterial soap? Not itching lately. Did not use steroid course. Possibly due to weed which she tried at that time for the first time. Has not tried again and is not interested.   Concerns today: none Periods: currently on first period after Nexplanon removal last month.  Contraception: recently had Nexplanon removed. Condoms currently. Not currently wanting another form of birthcontrol. Not currently wanting to get pregnant. Wants to see how her body reacts when not on birthcontrol: skin issues and weight.  Pelvic symptoms: none  Sexual activity: sexually active, same partner  STD Screening: requests urine Gc/Chlamydia and HIV  Pap smear status: would like to complete this at another time because she is on her period. We discussed that it is okay to complete today but patient insists.   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 Tries to be active every other day with sit ups or push ups with her boyfriend. She used to walk.  Diet: chicken majority of the time is fried, eats lots of vegetables. Reports she feels that she eats fairly healthy but thinks that her portion sizes are too large. Sweet tea and juice mainly. Lately cooking at home. Eat out: four times a week if she does not have money to get a lot of groceries. Patient given referral to nutrition but seems like she may have not reached out. She is interested today.  Smoking: never smoker Alcohol: occasioanly Drugs: none Mood: reports she has feelings depression/feeling down and anhedonia for the past few years. Lately been having a nonchalant attitude about work. Father passed away in 2008; symptoms possibly started after this. No SI/HI.  She usually "brushes aside feeling down".  Dentist: no, due to insurance  Eye: no difficulties with vision   ROS: See HPI  PMFSH:  Cancers in family: father's siblings died of cancer but uncertain of what type Family History reviewed and updated in chart  PMH: PMH: Allergic Rhinitis Hidradenitis  Obesity   PHYSICAL EXAM: BP 122/78   Pulse 75   Temp 98.2 F (36.8 C) (Oral)   Ht 5\' 10"  (1.778 m)   Wt (!) 331 lb 6.4 oz (150.3 kg)   LMP 08/18/2016   BMI 47.55 kg/m  Gen: NAD, obese, pleasant, cooperative HEENT: NCAT, PERRL, no palpable thyromegaly or anterior cervical lymphadenopathy Heart: RRR, no murmurs Lungs: CTAB, NWOB Abdomen: soft, nontender to palpation Neuro: grossly nonfocal, speech normal  ASSESSMENT/PLAN:  # Health maintenance:  -STD screening: urine Gc/Chlamydia, HIV -pap smear: patient requests to complete at another time due to being on her period; patient to make follow up appointment -immunizations: declined Flu, sent Rx for Tdap vaccine   Obesity:  - discussed the need for lifestyle changes. Patient seems interested/motivated in making changes. Discussed the importance of making short-term feasible. Patient to make a diet goal. Exercise goal is to walk 5 days a week. Patient to specify duration of walk per day. Encouraged to look at [email or web address]. Patient interested in referral to nutrition (may be difficult with her insurance). Will send message to Dr. Gerilyn PilgrimSykes if she will be able to see patient prior to referral     Palma HolterKanishka G Derrica Sieg, MD PGY 2 [facility]

## 2016-08-19 ENCOUNTER — Encounter: Payer: Self-pay | Admitting: Internal Medicine

## 2016-08-19 ENCOUNTER — Other Ambulatory Visit (HOSPITAL_COMMUNITY)
Admission: RE | Admit: 2016-08-19 | Discharge: 2016-08-19 | Disposition: A | Discharge status: Home / Self Care | Payer: Medicaid Other | Source: Ambulatory Visit | Attending: Family Medicine | Admitting: Family Medicine

## 2016-08-19 ENCOUNTER — Ambulatory Visit (INDEPENDENT_AMBULATORY_CARE_PROVIDER_SITE_OTHER): Payer: Medicaid Other | Admitting: Internal Medicine

## 2016-08-19 VITALS — BP 122/78 | HR 75 | Temp 98.2°F | Ht 70.0 in | Wt 331.4 lb

## 2016-08-19 DIAGNOSIS — Z113 Encounter for screening for infections with a predominantly sexual mode of transmission: Secondary | ICD-10-CM

## 2016-08-19 DIAGNOSIS — Z01419 Encounter for gynecological examination (general) (routine) without abnormal findings: Secondary | ICD-10-CM

## 2016-08-19 DIAGNOSIS — Z23 Encounter for immunization: Secondary | ICD-10-CM

## 2016-08-19 MED ORDER — TETANUS-DIPHTH-ACELL PERTUSSIS 5-2-15.5 LF-MCG/0.5 IM SUSP: 0.5000 mL | Freq: Once | INTRAMUSCULAR | 0 refills | Status: AC

## 2016-08-19 NOTE — Patient Instructions (Addendum)
Please make a follow up appointment for your pap.   Goals: Diet: think about what you would want to do for this. Remember it has to be measurable Exercise: walk 5 days a week.   Look at [email or web address]

## 2016-08-23 LAB — URINE CYTOLOGY ANCILLARY ONLY
Chlamydia: NEGATIVE
NEISSERIA GONORRHEA: NEGATIVE

## 2016-08-24 ENCOUNTER — Telehealth: Payer: Self-pay | Admitting: Internal Medicine

## 2016-08-24 ENCOUNTER — Encounter: Payer: Self-pay | Admitting: Internal Medicine

## 2016-08-24 NOTE — Telephone Encounter (Signed)
Opened in error

## 2016-08-24 NOTE — Progress Notes (Signed)
Sent letter to [facility] admin to mail regarding negative Gc/Chlamydia testing

## 2016-10-18 ENCOUNTER — Telehealth: Payer: Self-pay | Admitting: Internal Medicine

## 2016-10-18 NOTE — Telephone Encounter (Signed)
Returned patient's call. Reports she has a positive pregnancy test today. She is only using condoms for contraception. Her last period was Jan 16th. Reports that she had intercourse last Wednesday and the condom malfunctioned. She did not have enough money for plan B. She does not want to proceed with the pregnancy. I recommended that [facility]. Phone number given. Answered questions.

## 2016-10-18 NOTE — Telephone Encounter (Signed)
Will forward to MD. Lars Jeziorski,CMA  

## 2016-10-18 NOTE — Telephone Encounter (Signed)
Opened in error

## 2016-10-18 NOTE — Telephone Encounter (Signed)
Pt just found out she is pregnant, pt states she is not ok, sounds very scared. Pt would like Dr. Ottie GlazierGunadasa to call her as soon as possible [phone]. ep

## 2016-11-04 ENCOUNTER — Ambulatory Visit (HOSPITAL_COMMUNITY)
Admission: EM | Admit: 2016-11-04 | Discharge: 2016-11-04 | Disposition: A | Discharge status: Home / Self Care | Payer: Self-pay | Attending: Emergency Medicine | Admitting: Emergency Medicine

## 2016-11-04 ENCOUNTER — Encounter (HOSPITAL_COMMUNITY): Payer: Self-pay | Admitting: Emergency Medicine

## 2016-11-04 DIAGNOSIS — Z3201 Encounter for pregnancy test, result positive: Secondary | ICD-10-CM

## 2016-11-04 DIAGNOSIS — R110 Nausea: Secondary | ICD-10-CM

## 2016-11-04 DIAGNOSIS — Z3490 Encounter for supervision of normal pregnancy, unspecified, unspecified trimester: Secondary | ICD-10-CM

## 2016-11-04 LAB — POCT PREGNANCY, URINE: Preg Test, Ur: POSITIVE — AB

## 2016-11-04 MED ORDER — PRENATAL VITAMINS 28-0.8 MG PO TABS: 1.0000 | ORAL_TABLET | Freq: Every day | ORAL | 1 refills | Status: AC

## 2016-11-04 MED ORDER — DOXYLAMINE-PYRIDOXINE 10-10 MG PO TBEC: DELAYED_RELEASE_TABLET | ORAL | 0 refills | Status: DC

## 2016-11-04 NOTE — ED Provider Notes (Signed)
Marland KitchenadHPI  SUBJECTIVE:  Stacey Hendrix is a 21 y.o. female who presents with constant, daily nausea for the past 3 weeks. She reports intermittent, pelvic cramping with variable timing. She denies vomiting, abdominal pain, breast tenderness, vaginal bleeding. She tried Emetrol, light sodas with no improvement in her symptoms. There are no aggravating factors. LMP was approximately January 16 or 17th. She is in a long-term monogamous relationship with a female. Past medical history is a for diabetes, hypertension, gonorrhea, chlamydia, herpes, HIV, syphilis, Trichomonas, BV, yeast. PMD: [facility].  Past Medical History:  Diagnosis Date  . Obesity     History reviewed. No pertinent surgical history.  Family History  Problem Relation Age of Onset  . Alcohol abuse Mother   . Depression Mother   . Drug abuse Mother   . Drug abuse Father   . Diabetes Paternal Grandmother     Social History  Substance Use Topics  . Smoking status: Never Smoker  . Smokeless tobacco: Never Used  . Alcohol use No    No current facility-administered medications for this encounter.   Current Outpatient Prescriptions:  .  Doxylamine-Pyridoxine 10-10 MG TBEC, 2 tabs po at night., Disp: 60 tablet, Rfl: 0 .  hydrocortisone 2.5 % lotion, Apply topically 2 (two) times daily., Disp: 59 mL, Rfl: 0 .  Prenatal Vit-Fe Fumarate-FA (PRENATAL VITAMINS) 28-0.8 MG TABS, Take 1 tablet by mouth daily., Disp: 30 tablet, Rfl: 1  No Known Allergies   ROS  As noted in HPI.   Physical Exam  BP (!) 111/52 (BP Location: Right Arm)   Pulse 63   Temp 98.6 F (37 C) (Oral)   Resp 14   LMP 09/14/2016   SpO2 100%   Constitutional: Well developed, well nourished, no acute distress Eyes:  EOMI, conjunctiva normal bilaterally HENT: Normocephalic, atraumatic,mucus membranes moist Respiratory: Normal inspiratory effort Cardiovascular: Normal rate GI: nondistended, Nontender, nondistended. Active bowel  sounds. skin: No rash, skin intact Musculoskeletal: no deformities Neurologic: Alert & oriented x 3, no focal neuro deficits Psychiatric: Speech and behavior appropriate   ED Course   Medications - No data to display  Orders Placed This Encounter  Procedures  . Pregnancy, urine POC    Standing Status:   Standing    Number of Occurrences:   1    Results for orders placed or performed during the hospital encounter of 11/04/16 (from the past 24 hour(s))  Pregnancy, urine POC     Status: Abnormal   Collection Time: 11/04/16  6:56 PM  Result Value Ref Range   Preg Test, Ur POSITIVE (A) NEGATIVE   No results found.  ED Clinical Impression  Pregnancy, unspecified gestational age  Nausea   ED Assessment/Plan  Urine pregnancy positive. Believe this is the cause of her nausea.  She is 7 weeks and 3 days by LMP   No evidence of surgical abdomen, dehydration. Doubt ectopic. She has no history of PID, no vaginal bleeding.  We will send home with prenatal vitamins, Diclegis, referral to OB/GYN. Discussed labs, MDM, plan and followup with patient. Discussed sn/sx that should prompt return to the ED. Patient agrees with plan.   Meds ordered this encounter  Medications  . Doxylamine-Pyridoxine 10-10 MG TBEC    Sig: 2 tabs po at night.    Dispense:  60 tablet    Refill:  0  . Prenatal Vit-Fe Fumarate-FA (PRENATAL VITAMINS) 28-0.8 MG TABS    Sig: Take 1 tablet by mouth daily.    Dispense:  30 tablet    Refill:  1    *This clinic note was created using Scientist, clinical (histocompatibility and immunogenetics)Dragon dictation software. Therefore, there may be occasional mistakes despite careful proofreading.  ?   Domenick GongAshley Yaa Donnellan, MD 11/04/16 2145

## 2016-11-04 NOTE — Discharge Instructions (Signed)
Try the Diclegis. Start with taking 2 tablets by mouth at night. If you're still nauseous after 2 days, you may increase to 1 tab by mouth in the morning and 2 tabs by by mouth at night. If this does not work, you May increase to 1 tab in the morning, 1 tab mid afternoon in 2 tabs at night. take on an empty stomach. You can call the [facility] to see if they do prenatal care. You need an ultrasound and further blood work to determine how far long you are.

## 2016-11-04 NOTE — ED Triage Notes (Signed)
C/o feeling constant nauseas onset 3 weeks associated w/intermittent abd cramps  Denies vomiting, diarrhea, fevers  A&O x4... NAD

## 2016-12-15 ENCOUNTER — Ambulatory Visit (INDEPENDENT_AMBULATORY_CARE_PROVIDER_SITE_OTHER): Payer: Self-pay | Admitting: Family Medicine

## 2016-12-15 ENCOUNTER — Encounter: Payer: Self-pay | Admitting: Family Medicine

## 2016-12-15 VITALS — BP 102/80 | HR 84 | Temp 97.9°F | Ht 70.0 in | Wt 328.0 lb

## 2016-12-15 DIAGNOSIS — Z30017 Encounter for initial prescription of implantable subdermal contraceptive: Secondary | ICD-10-CM

## 2016-12-15 DIAGNOSIS — Z3201 Encounter for pregnancy test, result positive: Secondary | ICD-10-CM | POA: Insufficient documentation

## 2016-12-15 LAB — POCT URINE PREGNANCY: Preg Test, Ur: POSITIVE — AB

## 2016-12-15 NOTE — Patient Instructions (Signed)
I am sorry we were not able to place the Nexplanon. Please follow up in about two weeks or so for placement.  You can use a pregnancy test to make sure the pregnancy chemical has gone down when you come in next of Nexplanon placement

## 2016-12-15 NOTE — Progress Notes (Signed)
   [facility] Noralee Chars, MD Phone: [phone]  Reason For Visit: Nexplanon insertion   # Patient presenting with for nexplanon insertion. Had a positive pregnancy test at urgent care on March 9th. [facility]. Has not been sexually active since then. Patient still experiencing some mild spotting.   Past Medical History Reviewed problem list.  Medications- reviewed and updated No additions to family history Social history- patient is a non- smoker  Objective: BP 102/80 (BP Location: Left Arm, Patient Position: Sitting, Cuff Size: Large)   Pulse 84   Temp 97.9 F (36.6 C) (Oral)   Ht  (1.778 m)   Wt (!) 328 lb (148.8 kg)   LMP 09/03/2016 (Approximate) Comment: Recent pregnancy  SpO2 98%   BMI 47.06 kg/m  Gen: NAD, alert, cooperative with exam MSK: Normal gait and station Skin: dry, intact, no rashes or lesions  Assessment/Plan: See problem based a/p  Positive pregnancy test Currently with positive pregnancy test, likely will take about two more weeks for this to clear. Will hold off on nexplanon insertion for now. Patient does not plan on being sexual active for the next two [facility] Will follow in 2-3 weeks for nexplanon insertion. Advised to perform home pregnancy test before returning

## 2016-12-15 NOTE — Assessment & Plan Note (Signed)
Currently with positive pregnancy test, likely will take about two more weeks for this to clear. Will hold off on nexplanon insertion for now. Patient does not plan on being sexual active for the next two weeks [facility]. Will follow in 2-3 weeks for nexplanon insertion. Advised to perform home pregnancy test before returning

## 2017-01-02 ENCOUNTER — Ambulatory Visit (INDEPENDENT_AMBULATORY_CARE_PROVIDER_SITE_OTHER): Payer: Medicaid Other | Admitting: Student

## 2017-01-02 ENCOUNTER — Encounter: Payer: Self-pay | Admitting: Student

## 2017-01-02 VITALS — BP 124/70 | HR 91 | Temp 98.6°F | Ht 70.0 in | Wt 324.0 lb

## 2017-01-02 DIAGNOSIS — Z30013 Encounter for initial prescription of injectable contraceptive: Secondary | ICD-10-CM | POA: Diagnosis present

## 2017-01-02 DIAGNOSIS — Z30019 Encounter for initial prescription of contraceptives, unspecified: Secondary | ICD-10-CM

## 2017-01-02 DIAGNOSIS — Z3046 Encounter for surveillance of implantable subdermal contraceptive: Secondary | ICD-10-CM | POA: Diagnosis not present

## 2017-01-02 DIAGNOSIS — Z30017 Encounter for initial prescription of implantable subdermal contraceptive: Secondary | ICD-10-CM | POA: Diagnosis not present

## 2017-01-02 DIAGNOSIS — IMO0001 Reserved for inherently not codable concepts without codable children: Secondary | ICD-10-CM | POA: Insufficient documentation

## 2017-01-02 LAB — POCT URINE PREGNANCY: Preg Test, Ur: NEGATIVE

## 2017-01-02 NOTE — Patient Instructions (Signed)
Follow up as needed  Use a condom for the next 7 days after your Nexplanon placement If you have increasing pain, swelling or redness at the insertion site, return to the office Call the office at [phone] with questions or concerns

## 2017-01-02 NOTE — Progress Notes (Signed)
Subjective:    Patient ID: Stacey Hendrix, female    DOB: 1996/08/16, 21 y.o.   MRN: [id number]   CC: desire for nexplanon placement  HPI: 21 y/o presents for nexplanon placement   nexplanon placement - she has one until one year ago when it removed as it expired - she is still sexually active with one female partner but does not use condoms regularly - Patient's last menstrual period was 01/02/2017.  - she is left handed but had her last nexplanon in her left arm despite this. She does not care which arm is used - recently had an unintended pregnancy and had a termination  Smoking status reviewed  Review of Systems  Per HPI, else denies recent illness, fever,  chest pain, shortness of breath, abdominal pain, N/V/D, weakness    Objective:  BP 124/70   Pulse 91   Temp 98.6 F (37 C) (Oral)   Ht 5\' 10"  (1.778 m)   Wt (!) 324 lb (147 kg)   LMP 01/02/2017   SpO2 99%   BMI 46.49 kg/m  Vitals and nursing note reviewed  General: NAD Cardiac: RRR,  Respiratory: CTAB, normal effort Extremities: no edema or cyanosis. WWP. Skin: warm and dry, no rashes noted Neuro: alert and oriented, no focal deficits  Urine pregnancy negative   Assessment & Plan:    No problem-specific Assessment & Plan notes found for this encounter.    Shawnn Bouillon A. Kennon Rounds MD, MS Family Medicine Resident PGY-3 Pager [phone]

## 2017-01-02 NOTE — Assessment & Plan Note (Signed)
Urine pregnancy negative. Nexplanon placed ( see procedure note) - follow up precautions given - patient advised to use back up contraception for the next 7 days

## 2017-01-02 NOTE — Progress Notes (Signed)
PROCEDURE NOTE: Nexplanon insertion Patient given informed verbal consent.  Appropriate time out taken  Pregnancy test was negative.  The patient's  right arm was prepped and draped in the usual sterile fashion.. The ruler used to measure and mark the insertion area 8 cm from medial epicondyle of the elbow. Local anaesthesia obtained using 1.5 cc of 1% lidocaine with epinephrine. Nexplanon was inserted per manufacturer's directions. Less than 1 cc blood loss. The insertion site covered with antibiotic ointment and a pressure bandage to minimize bruising. There were no complications and the patient tolerated the procedure well.  Patient is informed the removal date will be in three years and package insert card filled out and given to her.  Birtie Fellman A. Kennon RoundsHaney MD, MS Family Medicine Resident PGY-3 Pager [phone]

## 2017-06-20 ENCOUNTER — Encounter (HOSPITAL_COMMUNITY): Payer: Self-pay | Admitting: Emergency Medicine

## 2017-06-20 DIAGNOSIS — H938X1 Other specified disorders of right ear: Secondary | ICD-10-CM | POA: Insufficient documentation

## 2017-06-20 DIAGNOSIS — H9011 Conductive hearing loss, unilateral, right ear, with unrestricted hearing on the contralateral side: Secondary | ICD-10-CM | POA: Insufficient documentation

## 2017-06-20 DIAGNOSIS — Z79899 Other long term (current) drug therapy: Secondary | ICD-10-CM | POA: Insufficient documentation

## 2017-06-20 NOTE — ED Triage Notes (Signed)
Pt states she has a bump in her right ear  Pt states it started about a week ago  Pt states it gets harder to hear out of her right ear everyday  Pt states it hurts worse when she lays down

## 2017-06-21 ENCOUNTER — Ambulatory Visit (HOSPITAL_COMMUNITY)
Admission: EM | Admit: 2017-06-21 | Discharge: 2017-06-21 | Disposition: A | Discharge status: Home / Self Care | Payer: Self-pay | Attending: Family Medicine | Admitting: Family Medicine

## 2017-06-21 ENCOUNTER — Emergency Department (HOSPITAL_COMMUNITY)
Admission: EM | Admit: 2017-06-21 | Discharge: 2017-06-21 | Disposition: A | Discharge status: Home / Self Care | Payer: Self-pay | Attending: Emergency Medicine | Admitting: Emergency Medicine

## 2017-06-21 ENCOUNTER — Encounter (HOSPITAL_COMMUNITY): Payer: Self-pay

## 2017-06-21 DIAGNOSIS — L989 Disorder of the skin and subcutaneous tissue, unspecified: Secondary | ICD-10-CM

## 2017-06-21 DIAGNOSIS — H938X1 Other specified disorders of right ear: Secondary | ICD-10-CM

## 2017-06-21 DIAGNOSIS — H9011 Conductive hearing loss, unilateral, right ear, with unrestricted hearing on the contralateral side: Secondary | ICD-10-CM

## 2017-06-21 DIAGNOSIS — H6191 Disorder of right external ear, unspecified: Secondary | ICD-10-CM

## 2017-06-21 NOTE — Discharge Instructions (Signed)
You can take ibuprofen 600-800mg  three times a day for inflammation and pain. Avoid pushing lesion as it can cause symptoms to be worse. Follow up with ENT for further evaluation and treatment.

## 2017-06-21 NOTE — ED Provider Notes (Signed)
[facility]-EMERGENCY DEPT Provider Note   CSN: [id number] Arrival date & time: 06/20/17  2238     History   Chief Complaint Chief Complaint  Patient presents with  . Otalgia    HPI Stacey Hendrix is a 21 y.o. female with a hx of obesity presents to the Emergency Department complaining of gradual, persistent, progressively worsening R otalgia onset 1 week ago. Associated symptoms include swelling of the of right ear canal.  She also endorses some decreased hearing in the R ear.  Pt reports she received a new piercing 3 mos ago, but has not had any complications prior to this.  Patient denies history of keloid formation.  URI ssx 2 weeks ago that resolved without recurrence.  Laying no the ear makes the pain worse. Pt denies fever, chills, headache, neck pain, chest pain, SOB, abd pain, N/V/D, trismus.     The history is provided by the patient and medical records. No language interpreter was used.    Past Medical History:  Diagnosis Date  . Obesity     Patient Active Problem List   Diagnosis Date Noted  . Contraception 01/02/2017  . Positive pregnancy test 12/15/2016  . Preventative health care 10/29/2012  . Family planning counseling 03/29/2011  . Hidradenitis 09/14/2009  . Severe obesity (BMI >= 40) ([facility]CC) 07/29/2008  . ALLERGIC RHINITIS DUE TO POLLEN 07/29/2008    History reviewed. No pertinent surgical history.  OB History    No data available       Home Medications    Prior to Admission medications   Medication Sig Start Date End Date Taking? Authorizing Provider  Doxylamine-Pyridoxine 10-10 MG TBEC 2 tabs po at night. 11/04/16   Domenick Gong, MD  hydrocortisone 2.5 % lotion Apply topically 2 (two) times daily. 09/15/15   Everlene Farrier, PA-C  Prenatal Vit-Fe Fumarate-FA (PRENATAL VITAMINS) 28-0.8 MG TABS Take 1 tablet by mouth daily. 11/04/16   Domenick Gong, MD    Family History Family History  Problem Relation Age of Onset  .  Alcohol abuse Mother   . Depression Mother   . Drug abuse Mother   . Drug abuse Father   . Diabetes Paternal Grandmother     Social History Social History  Substance Use Topics  . Smoking status: Never Smoker  . Smokeless tobacco: Never Used  . Alcohol use No     Allergies   Patient has no known allergies.   Review of Systems Review of Systems  Constitutional: Negative for appetite change, diaphoresis, fatigue, fever and unexpected weight change.  HENT: Positive for ear pain and hearing loss ( right ear). Negative for mouth sores.   Eyes: Negative for visual disturbance.  Respiratory: Negative for cough, chest tightness, shortness of breath and wheezing.   Cardiovascular: Negative for chest pain.  Gastrointestinal: Negative for abdominal pain, constipation, diarrhea, nausea and vomiting.  Endocrine: Negative for polydipsia, polyphagia and polyuria.  Genitourinary: Negative for dysuria, frequency, hematuria and urgency.  Musculoskeletal: Negative for back pain and neck stiffness.  Skin: Negative for rash.  Allergic/Immunologic: Negative for immunocompromised state.  Neurological: Negative for syncope, light-headedness and headaches.  Hematological: Does not bruise/bleed easily.  Psychiatric/Behavioral: Negative for sleep disturbance. The patient is not nervous/anxious.      Physical Exam Updated Vital Signs BP 117/74 (BP Location: Left Arm)   Pulse 92   Temp 97.8 F (36.6 C) (Oral)   Resp 18   Ht 5\' 9"  (1.753 m)   Wt (!) 147 kg (  324 lb)   SpO2 98%   BMI 47.85 kg/m   Physical Exam  Constitutional: She appears well-developed and well-nourished. No distress.  HENT:  Head: Normocephalic and atraumatic.  Right Ear: Tympanic membrane and external ear normal. There is tenderness. Decreased hearing is noted.  Left Ear: Tympanic membrane, external ear and ear canal normal.  Nose: No mucosal edema or rhinorrhea. No epistaxis. Right sinus exhibits no maxillary sinus  tenderness and no frontal sinus tenderness. Left sinus exhibits no maxillary sinus tenderness and no frontal sinus tenderness.  Mouth/Throat: Uvula is midline and mucous membranes are normal. Mucous membranes are not pale and not cyanotic. No oropharyngeal exudate, posterior oropharyngeal edema, posterior oropharyngeal erythema or tonsillar abscesses.  Mass noted to the right ear canal, soft in nature and pliable enough to be able to pass the otoscope speculum and visualize the TM.  Normal TM and no evidence of otitis externa.  Mass is flesh-colored without induration.  It is mildly tender.  Piercing of the tragus in place and mass does not extend to the site of the piercing. Trismus  Eyes: Conjunctivae are normal.  Neck: Normal range of motion and full passive range of motion without pain. No spinous process tenderness and no muscular tenderness present.  Cardiovascular: Normal rate and intact distal pulses.   Pulmonary/Chest: Effort normal. No stridor.  Clear and equal breath sounds without focal wheezes, rhonchi, rales  Abdominal: She exhibits no distension. There is no tenderness.  Obese abdomen  Musculoskeletal: Normal range of motion.  Lymphadenopathy:    She has no cervical adenopathy.  Neurological: She is alert.  Skin: Skin is warm and dry. No rash noted. She is not diaphoretic.  Psychiatric: She has a normal mood and affect.  Nursing note and vitals reviewed.    ED Treatments / Results   Procedures Procedures (including critical care time)  Medications Ordered in ED Medications - No data to display   Initial Impression / Assessment and Plan / ED Course  I have reviewed the triage vital signs and the nursing notes.  Pertinent labs & imaging results that were available during my care of the patient were reviewed by me and considered in my medical decision making (see chart for details).     Patient with flesh-colored mass occluding the right ear canal.  Possible infection  however no erythema, increased warmth or evidence of cellulitis.  No evidence of otitis externa.  More concern for lipoma versus a keloid versus growth.  Discussed with patient the need for ear nose and throat follow-up.  Recommended we initiate antibiotics here in the emergency department however will need pregnancy test first.  Patient does not wish to wait for pregnancy test.  She states she does not want these antibiotics.  Patient is unwilling to wait for me to consult with ear nose and throat.  I discussed with her that it may be difficult to obtain a follow-up appointment in a timely fashion if I have not done this.  Patient states that this is fine.  Encourage patient to follow-up with ear nose and throat as directed.  Instructed her to return to the emergency department for fevers, chills, neck stiffness, drainage or other concerns.  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

## 2017-06-21 NOTE — ED Triage Notes (Signed)
[facility], pt denies any injury. Pt has bump on rt ear.

## 2017-06-21 NOTE — Discharge Instructions (Signed)
1. Medications: usual home medications 2. Treatment: rest, drink plenty of fluids,  3. Follow Up: Please followup with ENT in 2-3 days for discussion of your diagnoses and further evaluation after today's visit; if you do not have a primary care doctor use the resource guide provided to find one; Please return to the ER for fevers, increasing mass or other concerns

## 2017-06-21 NOTE — ED Provider Notes (Signed)
[facility]    CSN: [id number] Arrival date & time: 06/21/17  1758     History   Chief Complaint Chief Complaint  Patient presents with  . Otalgia    HPI Normajean GlasgowDasha V Coulibaly is a 21 y.o. female.   21 year old female comes in for 1 week history of ear pain. She has a bump in her right ear that is painful to touch. Has decrease in hearing due to the bump. Denies fever, chills, night sweats. Had URI symptoms 3 weeks ago that has since resolved. Has been using peroxide without improvement.  She was seen at the ED earlier today for same symptoms, did not wait for ENT consult as she needed to leave for work.       Past Medical History:  Diagnosis Date  . Obesity     Patient Active Problem List   Diagnosis Date Noted  . Contraception 01/02/2017  . Positive pregnancy test 12/15/2016  . Preventative health care 10/29/2012  . Family planning counseling 03/29/2011  . Hidradenitis 09/14/2009  . Severe obesity (BMI >= 40) (HCC) 07/29/2008  . ALLERGIC RHINITIS DUE TO POLLEN 07/29/2008    History reviewed. No pertinent surgical history.  OB History    No data available       Home Medications    Prior to Admission medications   Medication Sig Start Date End Date Taking? Authorizing Provider  Doxylamine-Pyridoxine 10-10 MG TBEC 2 tabs po at night. 11/04/16   Domenick GongMortenson, Ashley, MD  hydrocortisone 2.5 % lotion Apply topically 2 (two) times daily. 09/15/15   Everlene Farrieransie, William, PA-C  Prenatal Vit-Fe Fumarate-FA (PRENATAL VITAMINS) 28-0.8 MG TABS Take 1 tablet by mouth daily. 11/04/16   Domenick GongMortenson, Ashley, MD    Family History Family History  Problem Relation Age of Onset  . Alcohol abuse Mother   . Depression Mother   . Drug abuse Mother   . Drug abuse Father   . Diabetes Paternal Grandmother     Social History Social History  Substance Use Topics  . Smoking status: Never Smoker  . Smokeless tobacco: Never Used  . Alcohol use No     Allergies   Patient has no  known allergies.   Review of Systems Review of Systems  Reason unable to perform ROS: See HPI as above.     Physical Exam Triage Vital Signs ED Triage Vitals  Enc Vitals Group     BP 06/21/17 1906 113/71     Pulse Rate 06/21/17 1906 83     Resp 06/21/17 1906 16     Temp 06/21/17 1906 97.8 F (36.6 C)     Temp Source 06/21/17 1906 Oral     SpO2 06/21/17 1906 100 %     Weight --      Height --      Head Circumference --      Peak Flow --      Pain Score 06/21/17 1907 6     Pain Loc --      Pain Edu? --      Excl. in [place]? --    No data found.   Updated Vital Signs BP 113/71 (BP Location: Left Arm)   Pulse 83   Temp 97.8 F (36.6 C) (Oral)   Resp 16   SpO2 100%   Physical Exam  Constitutional: She is oriented to person, place, and time. She appears well-developed and well-nourished. No distress.  HENT:  Head: Normocephalic and atraumatic.  Left Ear: Tympanic membrane,  external ear and ear canal normal. Tympanic membrane is not erythematous and not bulging.  Nose: Nose normal. Right sinus exhibits no maxillary sinus tenderness and no frontal sinus tenderness. Left sinus exhibits no maxillary sinus tenderness and no frontal sinus tenderness.  Mouth/Throat: Uvula is midline, oropharynx is clear and moist and mucous membranes are normal.  Right ear lesion covering ear canal. Tenderness on palpation. Able to pass otoscope with compression of lesion. TM normal without erythema, bulging.   Eyes: Pupils are equal, round, and reactive to light. Conjunctivae are normal.  Neck: Normal range of motion. Neck supple.  Cardiovascular: Normal rate, regular rhythm and normal heart sounds.  Exam reveals no gallop and no friction rub.   No murmur heard. Pulmonary/Chest: Effort normal and breath sounds normal. She has no decreased breath sounds. She has no wheezes. She has no rhonchi. She has no rales.  Lymphadenopathy:    She has no cervical adenopathy.  Neurological: She is alert and  oriented to person, place, and time.  Skin: Skin is warm and dry.  Psychiatric: She has a normal mood and affect. Her behavior is normal. Judgment normal.        UC Treatments / Results  Labs (all labs ordered are listed, but only abnormal results are displayed) Labs Reviewed - No data to display  EKG  EKG Interpretation None       Radiology No results found.  Procedures Procedures (including critical care time)  Medications Ordered in UC Medications - No data to display   Initial Impression / Assessment and Plan / UC Course  I have reviewed the triage vital signs and the nursing notes.  Pertinent labs & imaging results that were available during my care of the patient were reviewed by me and considered in my medical decision making (see chart for details).    Patient with mass in her ear without signs of cellulitis, otitis externa, otitis media. Can take ibuprofen for pain. Patient to follow up with ENT for further evaluation and treatment needed. Resources given. Patient expresses understanding and agrees to plan.   Final Clinical Impressions(s) / UC Diagnoses   Final diagnoses:  Lesion of skin of right ear    New Prescriptions Discharge Medication List as of 06/21/2017  7:40 PM        Belinda Fisher, PA-C 06/21/17 2045

## 2019-08-07 ENCOUNTER — Encounter (HOSPITAL_COMMUNITY): Payer: Self-pay

## 2019-08-07 ENCOUNTER — Other Ambulatory Visit: Payer: Self-pay

## 2019-08-07 ENCOUNTER — Ambulatory Visit (HOSPITAL_COMMUNITY)
Admission: EM | Admit: 2019-08-07 | Discharge: 2019-08-07 | Disposition: A | Discharge status: Home / Self Care | Payer: Self-pay | Attending: Family Medicine | Admitting: Family Medicine

## 2019-08-07 DIAGNOSIS — Z20828 Contact with and (suspected) exposure to other viral communicable diseases: Secondary | ICD-10-CM | POA: Insufficient documentation

## 2019-08-07 DIAGNOSIS — K1379 Other lesions of oral mucosa: Secondary | ICD-10-CM

## 2019-08-07 DIAGNOSIS — J029 Acute pharyngitis, unspecified: Secondary | ICD-10-CM | POA: Insufficient documentation

## 2019-08-07 DIAGNOSIS — J069 Acute upper respiratory infection, unspecified: Secondary | ICD-10-CM | POA: Insufficient documentation

## 2019-08-07 LAB — POCT RAPID STREP A: Streptococcus, Group A Screen (Direct): NEGATIVE

## 2019-08-07 NOTE — ED Triage Notes (Signed)
Pt states she has a sore throat and she has some white spots in the back of her throat. X 2 days

## 2019-08-07 NOTE — ED Provider Notes (Signed)
[facility]    CSN: [id number] Arrival date & time: 08/07/19  0803      History   Chief Complaint Chief Complaint  Patient presents with  . Sore Throat    HPI Stacey Hendrix is a 23 y.o. female.   Stacey Hendrix presents with complaints of sore throat which started two days ago. Mouth feels dry. Has increased water intake and has been using cough drops. Noted white spots to left back of throat. Has never had strep throat in the past. Pain with swallowing, 2/10 in severity only at this time. Ibuprofen has helped. No fevers. No headache, no body aches. Slight congestion, occasional cough. Blowing nose. No ear pain. Has had increased bowel movements, but has been menstruating so this can be normal for her. No known ill contacts. Works as a Estate agent. Without contributing medical history.  Without contributing medical history.      ROS per HPI, negative if not otherwise mentioned.      Past Medical History:  Diagnosis Date  . Obesity     Patient Active Problem List   Diagnosis Date Noted  . Contraception 01/02/2017  . Positive pregnancy test 12/15/2016  . Preventative health care 10/29/2012  . Family planning counseling 03/29/2011  . Hidradenitis 09/14/2009  . Severe obesity (BMI >= 40) ([facility]) 07/29/2008  . ALLERGIC RHINITIS DUE TO POLLEN 07/29/2008    History reviewed. No pertinent surgical history.  OB History   No obstetric history on file.      Home Medications    Prior to Admission medications   Medication Sig Start Date End Date Taking? Authorizing Provider  Doxylamine-Pyridoxine 10-10 MG TBEC 2 tabs po at night. 11/04/16   Melynda Ripple, MD  hydrocortisone 2.5 % lotion Apply topically 2 (two) times daily. 09/15/15   Waynetta Pean, PA-C  Prenatal Vit-Fe Fumarate-FA (PRENATAL VITAMINS) 28-0.8 MG TABS Take 1 tablet by mouth daily. 11/04/16   Melynda Ripple, MD    Family History Family History  Problem Relation Age of Onset  . Alcohol abuse  Mother   . Depression Mother   . Drug abuse Mother   . Drug abuse Father   . Diabetes Paternal Grandmother     Social History Social History   Tobacco Use  . Smoking status: Never Smoker  . Smokeless tobacco: Never Used  Substance Use Topics  . Alcohol use: No  . Drug use: No     Allergies   Patient has no known allergies.   Review of Systems Review of Systems   Physical Exam Triage Vital Signs ED Triage Vitals  Enc Vitals Group     BP 08/07/19 0825 117/78     Pulse Rate 08/07/19 0825 84     Resp 08/07/19 0825 18     Temp 08/07/19 0825 97.8 F (36.6 C)     Temp Source 08/07/19 0825 Oral     SpO2 08/07/19 0825 98 %     Weight 08/07/19 0823 (!) 308 lb 3.2 oz (139.8 kg)     Height --      Head Circumference --      Peak Flow --      Pain Score 08/07/19 0823 2     Pain Loc --      Pain Edu? --      Excl. in [place]? --    No data found.  Updated Vital Signs BP 117/78 (BP Location: Right Arm)   Pulse 84   Temp 97.8 F (36.6 C) (  Oral)   Resp 18   Wt (!) 308 lb 3.2 oz (139.8 kg)   LMP 08/05/2019   SpO2 98%   BMI 45.51 kg/m    Physical Exam Constitutional:      General: She is not in acute distress.    Appearance: She is well-developed.  HENT:     Mouth/Throat:     Tonsils: Tonsillar exudate present. 1+ on the right. 1+ on the left.  Cardiovascular:     Rate and Rhythm: Normal rate.  Pulmonary:     Effort: Pulmonary effort is normal.  Skin:    General: Skin is warm and dry.  Neurological:     Mental Status: She is alert and oriented to person, place, and time.      UC Treatments / Results  Labs (all labs ordered are listed, but only abnormal results are displayed) Labs Reviewed  NOVEL CORONAVIRUS, NAA (HOSP ORDER, SEND-OUT TO REF LAB; TAT 18-24 HRS)  CULTURE, GROUP A STREP [facility])  POCT RAPID STREP A    EKG   Radiology No results found.  Procedures Procedures (including critical care time)  Medications Ordered in UC Medications -  No data to display  Initial Impression / Assessment and Plan / UC Course  I have reviewed the triage vital signs and the nursing notes.  Pertinent labs & imaging results that were available during my care of the patient were reviewed by me and considered in my medical decision making (see chart for details).     Non toxic. Afebrile. Negative rapid strep. covid testing pending. Supportive cares recommended for likely viral illness. Return precautions provided. Patient verbalized understanding and agreeable to plan.   Final Clinical Impressions(s) / UC Diagnoses   Final diagnoses:  Acute upper respiratory infection     Discharge Instructions     Push fluids to ensure adequate hydration and keep secretions thin.  Tylenol and/or ibuprofen as needed for pain or fevers.  Throat lozenges, gargles, chloraseptic spray, warm teas, popsicles etc to help with throat pain.   Self isolate until covid results are back and negative.  Will notify you by phone of any positive findings. Your negative results will be sent through your MyChart.        ED Prescriptions    None     PDMP not reviewed this encounter.   Georgetta Haber, NP 08/07/19 [phone]

## 2019-08-07 NOTE — Discharge Instructions (Signed)
Push fluids to ensure adequate hydration and keep secretions thin. . °Tylenol and/or ibuprofen as needed for pain or fevers.   °Throat lozenges, gargles, chloraseptic spray, warm teas, popsicles etc to help with throat pain.   °Self isolate until covid results are back and negative.  °Will notify you by phone of any positive findings. Your negative results will be sent through your MyChart.     °

## 2019-08-09 LAB — NOVEL CORONAVIRUS, NAA (HOSP ORDER, SEND-OUT TO REF LAB; TAT 18-24 HRS): SARS-CoV-2, NAA: NOT DETECTED

## 2019-08-10 LAB — CULTURE, GROUP A STREP (THRC)

## 2019-09-24 ENCOUNTER — Ambulatory Visit (INDEPENDENT_AMBULATORY_CARE_PROVIDER_SITE_OTHER): Payer: Self-pay | Admitting: Family Medicine

## 2019-09-24 ENCOUNTER — Other Ambulatory Visit: Payer: Self-pay

## 2019-09-24 ENCOUNTER — Encounter: Payer: Self-pay | Admitting: Family Medicine

## 2019-09-24 VITALS — BP 124/72 | HR 97 | Wt 299.8 lb

## 2019-09-24 DIAGNOSIS — L732 Hidradenitis suppurativa: Secondary | ICD-10-CM

## 2019-09-24 MED ORDER — CLINDAMYCIN PHOSPHATE 1 % EX SOLN: Freq: Two times a day (BID) | CUTANEOUS | 2 refills | Status: AC

## 2019-09-24 NOTE — Patient Instructions (Signed)
It was so wonderful meeting you today.  Keep up the amazing job, getting into the 200s is a big deal!   I believe the flareups around her thighs are likely due to an occult hidradenitis suppurativa.  We can try a topical clindamycin solution twice daily for the next several months and see if this helps keep down flares and overall appearance of your skin.  As you continue to lose weight this will help as well.  I would like you to follow-up in 1 month or so to see how things are doing.

## 2019-09-24 NOTE — Progress Notes (Signed)
   Subjective:    Patient ID: Stacey Hendrix, female    DOB: Apr 29, 1996, 24 y.o.   MRN: [id number]   CC: "Breakout of bumps around thighs"   HPI: Stacey Hendrix is a very pleasant 24 year old female with elevated BMI presenting discuss the following:  Inflammatory nodules: Noted breakout of several areas around her pubic and groin area that appear like carbuncles/painful nodules.  She has been dealing with flares of this intermittently for about the past 10 years, however now that she is getting older she is becoming more self-conscious of the appearance of this area.  Notes some scarring after the areas resolve.  The nodules seem to come back in the same spot.  She has had a breakout occasionally in her armpits.  The nodule sometimes drain, other times involute on their own.  She has previously been diagnosed with possibly having hidradenitis suppurativa in the past, however she had no lesions at that time it was not officially diagnosed.  She has not tried anything other than supportive care including using fragrance free laundry detergent and nonfragranced Dove soap.  She make sure to wear loose underwear and clothes.  Does not smoke tobacco.  She is trying to lose weight, very excited as she just got into the 200s today (299), down at least 40 pounds.  Denies any concurrent fever, surrounding erythema, vaginal discharge, abdominal pain, or rash elsewhere.   Smoking status reviewed  Review of Systems Per HPI    Objective:  BP 124/72   Pulse 97   Wt 299 lb 12.8 oz (136 kg)   SpO2 99%   BMI 44.27 kg/m  Vitals and nursing note reviewed  General: NAD, pleasant Respiratory: Unlabored breathing Abdomen: soft Pelvic/skin: Mild scarring noted along mons pubis with 2 approximate 1 cm pink firm nodules along the lateral left mons pubis without any surrounding erythema or drainage.  Slightly tender to palpation.  Few other small scattered nodules.  Bilateral inner thighs without any lesions at this  time. Extremities: no edema or cyanosis. WWP. Neuro: alert and oriented, no focal deficits Psych: normal affect  Assessment & Plan:   Hidradenitis suppurativa Appearance and clinical history consistent with hidradenitis suppurativa, Hurley stage I-II.  Will trial topical clindamycin solution BID, provided with good Rx coupon (only has Medicaid family-planning).  Discussed continued supportive care including nonirritating detergents/soaps and working towards weight loss, congratulated her on doing a wonderful job with this already.  Additionally educated on typical disease course and set expectations that we may need to try several different medications to achieve remission.  Would consider screening for comorbid conditions on follow-up, including diabetes and PCOS.    Follow-up in approximately 1 month to assess changes or sooner if needed.  Leticia Penna DO Family Medicine Resident PGY-2

## 2019-09-24 NOTE — Assessment & Plan Note (Addendum)
Appearance and clinical history consistent with hidradenitis suppurativa, Hurley stage I-II.  Will trial topical clindamycin solution BID, provided with good Rx coupon (only has Medicaid family-planning).  Discussed continued supportive care including nonirritating detergents/soaps and working towards weight loss, congratulated her on doing a wonderful job with this already.  Additionally educated on typical disease course and set expectations that we may need to try several different medications to achieve remission.  Would consider screening for comorbid conditions on follow-up, including diabetes and PCOS.

## 2019-10-14 ENCOUNTER — Ambulatory Visit (HOSPITAL_COMMUNITY)
Admission: EM | Admit: 2019-10-14 | Discharge: 2019-10-14 | Disposition: A | Discharge status: Home / Self Care | Payer: Medicaid Other

## 2019-10-14 ENCOUNTER — Other Ambulatory Visit: Payer: Self-pay

## 2019-10-14 ENCOUNTER — Encounter (HOSPITAL_COMMUNITY): Payer: Self-pay

## 2019-10-14 DIAGNOSIS — K068 Other specified disorders of gingiva and edentulous alveolar ridge: Secondary | ICD-10-CM

## 2019-10-14 NOTE — ED Triage Notes (Signed)
Pt states her tonsils were bleeding after brushing her teeth this morning; pt states she is not having any pain.

## 2019-10-14 NOTE — Discharge Instructions (Addendum)
You likely irritated your gums while brushing your teeth today.  Follow up with a dentist.   Go to the Emergency Department for profuse bleeding from your mouth, or other concerning symptoms.

## 2019-10-14 NOTE — ED Provider Notes (Signed)
[facility]    CSN: [id number] Arrival date & time: 10/14/19  1044      History   Chief Complaint Chief Complaint  Patient presents with  . Tonsils Irritated    HPI Stacey Hendrix is a 24 y.o. female.   Reports that she saw blood in her mouth while brushing her teeth this morning. Denies this occurring previously. Denies ST, fever, n/v/d, chills, HA, body aches. Reports that she does not currently see a dentist.   The history is provided by the patient.    Past Medical History:  Diagnosis Date  . Obesity     Patient Active Problem List   Diagnosis Date Noted  . Hidradenitis suppurativa 09/24/2019  . Contraception 01/02/2017  . Positive pregnancy test 12/15/2016  . Preventative health care 10/29/2012  . Family planning counseling 03/29/2011  . Hidradenitis 09/14/2009  . Severe obesity (BMI >= 40) ([place]) 07/29/2008  . ALLERGIC RHINITIS DUE TO POLLEN 07/29/2008    History reviewed. No pertinent surgical history.  OB History   No obstetric history on file.      Home Medications    Prior to Admission medications   Medication Sig Start Date End Date Taking? Authorizing Provider  clindamycin (CLEOCIN-T) 1 % external solution Apply topically 2 (two) times daily. 09/24/19   Patriciaann Clan, DO  Doxylamine-Pyridoxine 10-10 MG TBEC 2 tabs po at night. 11/04/16   Melynda Ripple, MD  hydrocortisone 2.5 % lotion Apply topically 2 (two) times daily. 09/15/15   Waynetta Pean, PA-C  Prenatal Vit-Fe Fumarate-FA (PRENATAL VITAMINS) 28-0.8 MG TABS Take 1 tablet by mouth daily. 11/04/16   Melynda Ripple, MD    Family History Family History  Problem Relation Age of Onset  . Alcohol abuse Mother   . Depression Mother   . Drug abuse Mother   . Drug abuse Father   . Diabetes Paternal Grandmother     Social History Social History   Tobacco Use  . Smoking status: Never Smoker  . Smokeless tobacco: Never Used  Substance Use Topics  . Alcohol use: No  .  Drug use: No     Allergies   Patient has no known allergies.   Review of Systems Review of Systems  Constitutional: Negative for chills, fatigue and fever.  HENT: Positive for dental problem. Negative for congestion, ear pain, mouth sores, sore throat and trouble swallowing.   Eyes: Negative for pain and visual disturbance.  Respiratory: Negative for cough and shortness of breath.   Cardiovascular: Negative for chest pain and palpitations.  Gastrointestinal: Negative for abdominal pain, diarrhea, nausea and vomiting.  Genitourinary: Negative for dysuria and hematuria.  Musculoskeletal: Negative for arthralgias, back pain and myalgias.  Skin: Negative for color change and rash.  Neurological: Negative for dizziness, seizures, syncope, facial asymmetry, light-headedness, numbness and headaches.  All other systems reviewed and are negative.    Physical Exam Triage Vital Signs ED Triage Vitals [10/14/19 1111]  Enc Vitals Group     BP 123/60     Pulse Rate 75     Resp 18     Temp 98.6 F (37 C)     Temp Source Oral     SpO2 100 %     Weight      Height      Head Circumference      Peak Flow      Pain Score 0     Pain Loc      Pain Edu?  Excl. in GC?    No data found.  Updated Vital Signs BP 123/60 (BP Location: Left Arm)   Pulse 75   Temp 98.6 F (37 C) (Oral)   Resp 18   LMP 10/03/2019   SpO2 100%   Visual Acuity Right Eye Distance:   Left Eye Distance:   Bilateral Distance:    Right Eye Near:   Left Eye Near:    Bilateral Near:     Physical Exam Vitals and nursing note reviewed.  Constitutional:      General: She is not in acute distress.    Appearance: She is well-developed. She is obese.  HENT:     Head: Normocephalic and atraumatic.     Right Ear: Tympanic membrane normal.     Left Ear: Tympanic membrane normal.     Nose: Nose normal.     Mouth/Throat:     Mouth: Mucous membranes are moist.     Pharynx: No oropharyngeal exudate or  posterior oropharyngeal erythema.  Eyes:     Conjunctiva/sclera: Conjunctivae normal.  Cardiovascular:     Rate and Rhythm: Normal rate and regular rhythm.     Pulses: Normal pulses.     Heart sounds: Normal heart sounds. No murmur.  Pulmonary:     Effort: Pulmonary effort is normal. No respiratory distress.     Breath sounds: Normal breath sounds.  Abdominal:     General: Abdomen is flat. Bowel sounds are normal.     Palpations: Abdomen is soft.     Tenderness: There is no abdominal tenderness.  Musculoskeletal:        General: Normal range of motion.     Cervical back: Neck supple.  Skin:    General: Skin is warm and dry.     Capillary Refill: Capillary refill takes less than 2 seconds.  Neurological:     General: No focal deficit present.     Mental Status: She is alert and oriented to person, place, and time.  Psychiatric:        Mood and Affect: Mood normal.        Behavior: Behavior normal.      UC Treatments / Results  Labs (all labs ordered are listed, but only abnormal results are displayed) Labs Reviewed - No data to display  EKG   Radiology No results found.  Procedures Procedures (including critical care time)  Medications Ordered in UC Medications - No data to display  Initial Impression / Assessment and Plan / UC Course  I have reviewed the triage vital signs and the nursing notes.  Pertinent labs & imaging results that were available during my care of the patient were reviewed by me and considered in my medical decision making (see chart for details).     Likely bleeding gums this morning with teeth brushing. Instructed to establish dental care. Instructed on when to report to the ED. Final Clinical Impressions(s) / UC Diagnoses   Final diagnoses:  Bleeding gums     Discharge Instructions     You likely irritated your gums while brushing your teeth today.  Follow up with a dentist.   Go to the Emergency Department for profuse bleeding  from your mouth, or other concerning symptoms.     ED Prescriptions    None     I have reviewed the PDMP during this encounter.   Moshe Cipro, NP 10/14/19 1140

## 2019-12-17 ENCOUNTER — Encounter: Payer: Self-pay | Admitting: Family Medicine

## 2019-12-17 ENCOUNTER — Other Ambulatory Visit: Payer: Self-pay

## 2019-12-17 ENCOUNTER — Ambulatory Visit (INDEPENDENT_AMBULATORY_CARE_PROVIDER_SITE_OTHER): Payer: Medicaid Other | Admitting: Family Medicine

## 2019-12-17 ENCOUNTER — Other Ambulatory Visit (HOSPITAL_COMMUNITY)
Admission: RE | Admit: 2019-12-17 | Discharge: 2019-12-17 | Disposition: A | Discharge status: Home / Self Care | Payer: Medicaid Other | Source: Ambulatory Visit | Attending: Family Medicine | Admitting: Family Medicine

## 2019-12-17 VITALS — BP 110/72 | HR 97 | Ht 69.0 in | Wt 303.8 lb

## 2019-12-17 DIAGNOSIS — Z124 Encounter for screening for malignant neoplasm of cervix: Secondary | ICD-10-CM

## 2019-12-17 DIAGNOSIS — Z113 Encounter for screening for infections with a predominantly sexual mode of transmission: Secondary | ICD-10-CM

## 2019-12-17 DIAGNOSIS — Z131 Encounter for screening for diabetes mellitus: Secondary | ICD-10-CM

## 2019-12-17 DIAGNOSIS — Z0000 Encounter for general adult medical examination without abnormal findings: Secondary | ICD-10-CM

## 2019-12-17 LAB — POCT GLYCOSYLATED HEMOGLOBIN (HGB A1C): Hemoglobin A1C: 5.3 % (ref 4.0–5.6)

## 2019-12-17 NOTE — Progress Notes (Signed)
   Subjective:    Patient ID: Stacey Hendrix, female    DOB: 1996-06-03, 24 y.o.   MRN: [id number]   CC: Pap smear and STD screening  HPI:  Stacey Hendrix is a very pleasant 24 year old the presents today for her Pap smear.  She denies concerns or complaints at this time.  She does state that she started with a new partner about 6 months ago and would be interested in checking for STDs though she does not have any symptoms at this time and has no concerns.  She specifically denies vaginal pain, itching, discharge.  Screening for diabetes: Due to her recent diagnosis of hidradenitis patient will be screened for comorbid conditions, including diabetes.  She states she is unsure if she has been screened for diabetes in the past but would be interested in this today.  ROS: pertinent noted in the HPI   Pertinent PMH, PSH, FH, SoHx: Past medical history significant for hidradenitis  Objective:  BP 110/72   Pulse 97   Ht 5\' 9"  (1.753 m)   Wt (!) 303 lb 12.8 oz (137.8 kg)   SpO2 97%   BMI 44.86 kg/m   Vitals and nursing note reviewed  General: NAD, pleasant, able to participate in exam Pelvic exam: VULVA: normal appearing vulva with no masses, tenderness or lesions, VAGINA: normal appearing vagina with normal color and discharge, no lesions, CERVIX: normal appearing cervix without discharge or lesions. Psych: Normal affect and mood   Assessment & Plan:    Severe obesity (BMI >= 40) Assessment: Patient with BMI greater than 40 with history of hydradenitis which can be associated with diabetes mellitus. Plan: -We'll screen for diabetes with A1c -Discussed importance of diet and exercise with patient  Preventative health care Assessment: Patient presented today for Pap smear as well as STD screening with no overall concerns. Plan: -Performed Pap smear as well as screening for STDs including gonorrhea/chlamydia/trichomonas/syphilis/HIV.   , DO [facility]  Med[facility] PGY-1

## 2019-12-17 NOTE — Assessment & Plan Note (Signed)
Assessment: Patient presented today for Pap smear as well as STD screening with no overall concerns. Plan: -Performed Pap smear as well as screening for STDs including gonorrhea/chlamydia/trichomonas/syphilis/HIV.

## 2019-12-17 NOTE — Assessment & Plan Note (Signed)
Assessment: Patient with BMI greater than 40 with history of hydradenitis which can be associated with diabetes mellitus. Plan: -We'll screen for diabetes with A1c -Discussed importance of diet and exercise with patient

## 2019-12-17 NOTE — Patient Instructions (Addendum)
It was great to see you!  Our plans for today:  -Today we performed your Pap smear and some screening for STDs including gonorrhea/chlamydia, trichomonas, HIV, and syphilis.   -We are also screen for diabetes with an A1c. -Anytime screening for diabetes I would like to recommend focus on diet and exercise as this is one of the keys to healthy lifestyle.  If you ever have any questions about initiate diet and exercise I would recommend scheduling a follow-up appointment where we can discuss this in detail.  I will call you if you with your results.  Take care and seek immediate care sooner if you develop any concerns.   Dr. Daymon Larsen [facility]

## 2019-12-18 LAB — HIV ANTIBODY (ROUTINE TESTING W REFLEX): HIV Screen 4th Generation wRfx: NONREACTIVE

## 2019-12-18 LAB — RPR: RPR Ser Ql: NONREACTIVE

## 2019-12-19 LAB — CYTOLOGY - PAP
Chlamydia: NEGATIVE
Comment: NEGATIVE
Comment: NEGATIVE
Comment: NORMAL
Diagnosis: NEGATIVE
Neisseria Gonorrhea: NEGATIVE
Trichomonas: NEGATIVE

## 2019-12-31 ENCOUNTER — Ambulatory Visit: Payer: Medicaid Other | Admitting: Family Medicine

## 2019-12-31 ENCOUNTER — Other Ambulatory Visit: Payer: Self-pay

## 2019-12-31 ENCOUNTER — Ambulatory Visit (INDEPENDENT_AMBULATORY_CARE_PROVIDER_SITE_OTHER): Payer: Medicaid Other | Admitting: Family Medicine

## 2019-12-31 VITALS — BP 124/72 | HR 75 | Ht 69.0 in | Wt 299.6 lb

## 2019-12-31 DIAGNOSIS — Z3046 Encounter for surveillance of implantable subdermal contraceptive: Secondary | ICD-10-CM | POA: Diagnosis not present

## 2019-12-31 DIAGNOSIS — Z3202 Encounter for pregnancy test, result negative: Secondary | ICD-10-CM | POA: Diagnosis not present

## 2019-12-31 LAB — POCT URINE PREGNANCY: Preg Test, Ur: NEGATIVE

## 2019-12-31 NOTE — Progress Notes (Signed)
    SUBJECTIVE:   CHIEF COMPLAINT / HPI:   Nexplanon Removal and Replacement Placed on 01/02/2017 Patient's last menstrual period was 12/24/2019. Currently has in her second nexplanon, has a period every month Is very happy with her nexplanon and wants to get another one Does not want to look into any other options  PERTINENT  PMH / PSH: Obesity, HS  OBJECTIVE:   BP 124/72   Pulse 75   Ht 5\' 9"  (1.753 m)   Wt 299 lb 9.6 oz (135.9 kg)   LMP 12/24/2019   SpO2 98%   BMI 44.24 kg/m    Physical Exam:  General: 24 y.o. female in NAD Lungs: Breathing comfortably on RA Skin: warm and dry Extremities: nexplanon in place in right arm with overlying tattoo  PROCEDURE NOTE: NEXPLANON  REMOVAL and REINSERTION  REMOVAL Patient given informed consent and signed copy in the chart for both procedures. an appropriate time out was been taken Pregnancy test negative Right  arm area prepped and draped in the usual sterile fashion. Three cc of 1% lidocaine without epinephrine used for local anesthesia. A small stab incision was made close to the nexplanon with scalpel. Hemostats were used to withdraw the nexplanon  INSERTION new NEXPLANON  Prior to the removal procedure, an appropriate time out had been taken and  there were no breaks in patient care between the procedures. Sterile field had been maintained as well as local anesthesia.   Nexplanon was inserted in typical fashion in the  Right ARM (same site previously used) and where removal was performed. There were no complications.  Pressure bandage was  applied to decrease bruising. Patient given follow up instructions should she experience redness, swelling at sight or fever in the next 24 hours. Patient given Nexplanon pocket card.   ASSESSMENT/PLAN:   Encounter for removal and reinsertion of Nexplanon Patient happy with current Nexplanon.  Within 3 years, therefore okay to replace today.  Patient did not want to discuss further birth  control options.  Patient is left-handed, currently Nexplanon is on right arm.  Patient desires to keep it in this time.  Removed and inserted a new Nexplanon in right arm.  Patient tolerated procedure well.  See procedure note per above.     22, DO [facility]

## 2019-12-31 NOTE — Assessment & Plan Note (Signed)
Patient happy with current Nexplanon.  Within 3 years, therefore okay to replace today.  Patient did not want to discuss further birth control options.  Patient is left-handed, currently Nexplanon is on right arm.  Patient desires to keep it in this time.  Removed and inserted a new Nexplanon in right arm.  Patient tolerated procedure well.  See procedure note per above.

## 2019-12-31 NOTE — Patient Instructions (Signed)
Nexplanon Instructions After Insertion   Keep bandage clean and dry for 24 hours   May use ice/Tylenol/Ibuprofen for soreness or pain   If you develop fever, drainage or increased warmth from incision site-contact office immediately  Your nexplanon is good for 3 years.

## 2020-01-01 MED ORDER — ETONOGESTREL 68 MG ~~LOC~~ IMPL
68.0000 mg | DRUG_IMPLANT | Freq: Once | SUBCUTANEOUS | Status: AC
  Administered 2019-12-31: 15:00:00 68 mg via SUBCUTANEOUS

## 2020-01-01 NOTE — Addendum Note (Signed)
Addended by: Veronda Prude on: 01/01/2020 09:53 AM   Modules accepted: Orders

## 2021-02-15 DIAGNOSIS — Z1389 Encounter for screening for other disorder: Secondary | ICD-10-CM | POA: Diagnosis not present

## 2021-02-15 DIAGNOSIS — Z01419 Encounter for gynecological examination (general) (routine) without abnormal findings: Secondary | ICD-10-CM | POA: Diagnosis not present

## 2021-02-15 DIAGNOSIS — Z202 Contact with and (suspected) exposure to infections with a predominantly sexual mode of transmission: Secondary | ICD-10-CM | POA: Diagnosis not present

## 2021-02-15 DIAGNOSIS — Z130 Encounter for screening for diseases of the blood and blood-forming organs and certain disorders involving the immune mechanism: Secondary | ICD-10-CM | POA: Diagnosis not present

## 2021-02-15 DIAGNOSIS — Z113 Encounter for screening for infections with a predominantly sexual mode of transmission: Secondary | ICD-10-CM | POA: Diagnosis not present

## 2021-08-17 ENCOUNTER — Emergency Department (HOSPITAL_COMMUNITY)
Admission: EM | Admit: 2021-08-17 | Discharge: 2021-08-17 | Disposition: A | Discharge status: Home / Self Care | Payer: BC Managed Care – PPO | Attending: Student | Admitting: Student

## 2021-08-17 DIAGNOSIS — Z5321 Procedure and treatment not carried out due to patient leaving prior to being seen by health care provider: Secondary | ICD-10-CM | POA: Diagnosis not present

## 2021-08-17 DIAGNOSIS — R519 Headache, unspecified: Secondary | ICD-10-CM | POA: Diagnosis not present

## 2021-08-17 MED ORDER — IBUPROFEN 800 MG PO TABS
800.0000 mg | ORAL_TABLET | Freq: Once | ORAL | Status: AC
  Administered 2021-08-17: 17:00:00 800 mg via ORAL
  Filled 2021-08-17: qty 1

## 2021-08-17 NOTE — ED Notes (Signed)
Pt decided that she would be leaving because she felt better. Pt seen leaving ED.

## 2021-08-17 NOTE — ED Provider Notes (Signed)
Emergency Medicine Provider Triage Evaluation Note  Stacey Hendrix , a 25 y.o. female  was evaluated in triage.  Pt complains of headache.  Symptoms started a couple of hours ago.  Headache is right-sided.  Started gradually and worsened.  No significant light sensitivity.  No head injury.  Denies weakness, numbness, or tingling in arms or legs.  She is able to walk and talk normally.  She took 1 g Tylenol total her symptoms.  Typically this helps her headaches but not today.  She does not have a history of migraines.  Review of Systems  Positive: HA Negative: vomiting  Physical Exam  BP 112/82 (BP Location: Right Arm)    Pulse 67    Temp 98.5 F (36.9 C) (Oral)    Resp 14    SpO2 100%  Gen:   Awake, no distress   Resp:  Normal effort  MSK:   Moves extremities without difficulty  Other:  Normal gross neuro exam  Medical Decision Making  Medically screening exam initiated at 4:28 PM.  Appropriate orders placed.  Stacey Hendrix was informed that the remainder of the evaluation will be completed by another provider, this initial triage assessment does not replace that evaluation, and the importance of remaining in the ED until their evaluation is complete.     Renne Crigler, PA-C 08/17/21 1629    Glendora Score, MD 08/17/21 [phone]

## 2021-08-17 NOTE — ED Triage Notes (Signed)
Pt c/o HA "right behind my R eye" x "a couple hours." Took 1000mg  tylenol. No migraine hx, denies nausea/vomiting. No neuro deficits in triage.

## 2021-12-24 ENCOUNTER — Ambulatory Visit (INDEPENDENT_AMBULATORY_CARE_PROVIDER_SITE_OTHER): Payer: BC Managed Care – PPO | Admitting: Family Medicine

## 2021-12-24 ENCOUNTER — Encounter: Payer: Self-pay | Admitting: Family Medicine

## 2021-12-24 VITALS — BP 114/67 | HR 83 | Ht 69.0 in | Wt 302.6 lb

## 2021-12-24 DIAGNOSIS — Z23 Encounter for immunization: Secondary | ICD-10-CM

## 2021-12-24 DIAGNOSIS — Z0001 Encounter for general adult medical examination with abnormal findings: Secondary | ICD-10-CM | POA: Diagnosis not present

## 2021-12-24 DIAGNOSIS — L732 Hidradenitis suppurativa: Secondary | ICD-10-CM | POA: Diagnosis not present

## 2021-12-24 MED ORDER — SILVER SULFADIAZINE 1 % EX CREA: 1.0000 "application " | TOPICAL_CREAM | Freq: Every day | CUTANEOUS | 3 refills | Status: AC

## 2021-12-24 NOTE — Assessment & Plan Note (Signed)
Patient has not yet tried topical or systemic tx. Silvadene prescription given. Can try abx if no improvement with silvadene. Patient is working on weight loss with lifestyle changes. Can consider referral to derm/rheum for biologics if no improvement with weight loss and conservative tx. ?

## 2021-12-24 NOTE — Patient Instructions (Addendum)
It was a pleasure to see you today! ? ?For HS: try silvadene cream twice a day when you have a flare. If no improvement with warm compress and cream, call and we can try a round of antibiotics ?If no improvement with antibiotics, consider seeing a rheumatologist to discuss biologics for HS treatment ?Follow up in 1 year for physical ?Keep up the good work with exercise! ? ?Be Well, ? ?Dr. Leary Roca ? ?Health Maintenance, Female ?Adopting a healthy lifestyle and getting preventive care are important in promoting health and wellness. Ask your health care provider about: ?The right schedule for you to have regular tests and exams. ?Things you can do on your own to prevent diseases and keep yourself healthy. ?What should I know about diet, weight, and exercise? ?Eat a healthy diet ? ?Eat a diet that includes plenty of vegetables, fruits, low-fat dairy products, and lean protein. ?Do not eat a lot of foods that are high in solid fats, added sugars, or sodium. ?Maintain a healthy weight ?Body mass index (BMI) is used to identify weight problems. It estimates body fat based on height and weight. Your health care provider can help determine your BMI and help you achieve or maintain a healthy weight. ?Get regular exercise ?Get regular exercise. This is one of the most important things you can do for your health. Most adults should: ?Exercise for at least 150 minutes each week. The exercise should increase your heart rate and make you sweat (moderate-intensity exercise). ?Do strengthening exercises at least twice a week. This is in addition to the moderate-intensity exercise. ?Spend less time sitting. Even light physical activity can be beneficial. ?Watch cholesterol and blood lipids ?Have your blood tested for lipids and cholesterol at 28 years of age, then have this test every 5 years. ?Have your cholesterol levels checked more often if: ?Your lipid or cholesterol levels are high. ?You are older than 28 years of age. ?You are  at high risk for heart disease. ?What should I know about cancer screening? ?Depending on your health history and family history, you may need to have cancer screening at various ages. This may include screening for: ?Breast cancer. ?Cervical cancer. ?Colorectal cancer. ?Skin cancer. ?Lung cancer. ?What should I know about heart disease, diabetes, and high blood pressure? ?Blood pressure and heart disease ?High blood pressure causes heart disease and increases the risk of stroke. This is more likely to develop in people who have high blood pressure readings or are overweight. ?Have your blood pressure checked: ?Every 3-5 years if you are 69-50 years of age. ?Every year if you are 32 years old or older. ?Diabetes ?Have regular diabetes screenings. This checks your fasting blood sugar level. Have the screening done: ?Once every three years after age 31 if you are at a normal weight and have a low risk for diabetes. ?More often and at a younger age if you are overweight or have a high risk for diabetes. ?What should I know about preventing infection? ?Hepatitis B ?If you have a higher risk for hepatitis B, you should be screened for this virus. Talk with your health care provider to find out if you are at risk for hepatitis B infection. ?Hepatitis C ?Testing is recommended for: ?Everyone born from [birth year] through 1967. ?Anyone with known risk factors for hepatitis C. ?Sexually transmitted infections (STIs) ?Get screened for STIs, including gonorrhea and chlamydia, if: ?You are sexually active and are younger than 28 years of age. ?You are older than 28 years of  age and your health care provider tells you that you are at risk for this type of infection. ?Your sexual activity has changed since you were last screened, and you are at increased risk for chlamydia or gonorrhea. Ask your health care provider if you are at risk. ?Ask your health care provider about whether you are at high risk for HIV. Your health care provider  may recommend a prescription medicine to help prevent HIV infection. If you choose to take medicine to prevent HIV, you should first get tested for HIV. You should then be tested every 3 months for as long as you are taking the medicine. ?Pregnancy ?If you are about to stop having your period (premenopausal) and you may become pregnant, seek counseling before you get pregnant. ?Take 400 to 800 micrograms (mcg) of folic acid every day if you become pregnant. ?Ask for birth control (contraception) if you want to prevent pregnancy. ?Osteoporosis and menopause ?Osteoporosis is a disease in which the bones lose minerals and strength with aging. This can result in bone fractures. If you are 52 years old or older, or if you are at risk for osteoporosis and fractures, ask your health care provider if you should: ?Be screened for bone loss. ?Take a calcium or vitamin D supplement to lower your risk of fractures. ?Be given hormone replacement therapy (HRT) to treat symptoms of menopause. ?Follow these instructions at home: ?Alcohol use ?Do not drink alcohol if: ?Your health care provider tells you not to drink. ?You are pregnant, may be pregnant, or are planning to become pregnant. ?If you drink alcohol: ?Limit how much you have to: ?0-1 drink a day. ?Know how much alcohol is in your drink. In the [place] one drink equals one 12 oz bottle of beer (355 mL), one 5 oz glass of wine (148 mL), or one 1? oz glass of hard liquor (44 mL). ?Lifestyle ?Do not use any products that contain nicotine or tobacco. These products include cigarettes, chewing tobacco, and vaping devices, such as e-cigarettes. If you need help quitting, ask your health care provider. ?Do not use street drugs. ?Do not share needles. ?Ask your health care provider for help if you need support or information about quitting drugs. ?General instructions ?Schedule regular health, dental, and eye exams. ?Stay current with your vaccines. ?Tell your health care provider  if: ?You often feel depressed. ?You have ever been abused or do not feel safe at home. ?Summary ?Adopting a healthy lifestyle and getting preventive care are important in promoting health and wellness. ?Follow your health care provider's instructions about healthy diet, exercising, and getting tested or screened for diseases. ?Follow your health care provider's instructions on monitoring your cholesterol and blood pressure. ?This information is not intended to replace advice given to you by your health care provider. Make sure you discuss any questions you have with your health care provider. ?Document Revised: 01/04/2021 Document Reviewed: 01/04/2021 ?Elsevier Patient Education ? 2023 [employer]vier Inc. ? ?

## 2021-12-24 NOTE — Progress Notes (Signed)
? ? ?  SUBJECTIVE:  ? ?Chief compliant/HPI: annual examination ? ?Stacey Hendrix is a 26 y.o. who presents today for an annual exam.  ? ?Birth control: nexplanon placed in R arm in May 2021. Due for removal/replacement in May 2024. ? ?HS: patient has had HS for a few years, first noticed in late high school. She has had increasing episodes in both axillae, under her breasts and between her thighs, though none active today. She is curious about what to do for this. She has tried warm compresses which helps with the small boils. ? ?Exercise: walking daily, weight lifting several time per week.  ? ?Review of systems form notable for recent HS flare.  ? ?Updated history tabs and problem list.  ? ?OBJECTIVE:  ? ?BP 114/67   Pulse 83   Ht $R'5\' 9"'hy$  (1.753 m)   Wt (!) 302 lb 9.6 oz (137.3 kg)   SpO2 100%   BMI 44.69 kg/m?   ?Nursing note and vitals reviewed ?GEN: age-appropriate, AAW, resting comfortably in chair, NAD, class III obesity ?HEENT: NCAT. PERRLA. Sclera without injection or icterus. MMM.  ?Neck: Supple. No LAD. ?Cardiac: Regular rate and rhythm. Normal S1/S2. No murmurs, rubs, or gallops appreciated. 2+ radial pulses. ?Lungs: Clear bilaterally to ascultation. No increased WOB, no accessory muscle usage. No w/r/r. ?Skin: hyperpigmentation and sinus tracts located at inferior aspects of b/l axillae, underneath both breasts ?Neuro: AOx3  ?Ext: no edema ?Psych: Pleasant and appropriate  ?ASSESSMENT/PLAN:  ? ?Severe obesity (BMI >= 40) ?Patient has started to exercise regularly. Previous A1c's all normal. Commended her for increasing exercise. No complaints today. ? ?Hidradenitis suppurativa ?Patient has not yet tried topical or systemic tx. Silvadene prescription given. Can try abx if no improvement with silvadene. Patient is working on weight loss with lifestyle changes. Can consider referral to derm/rheum for biologics if no improvement with weight loss and conservative tx. ?  ?Annual Examination  ?See AVS for  age appropriate recommendations.  ? ?PHQ score 0, reviewed and discussed. ?Blood pressure reviewed and at goal.  ?Asked about intimate partner violence and patient reports none.  ?The patient currently uses nexplanon for contraception. Folate not necessary. ? ?Considered the following items based upon USPSTF recommendations: ?HIV testing: discussed and prefers to do at [facility] ?Hepatitis C: discussed and prefers to do at [facility] ?Hepatitis B:  immunized ?Syphilis if at high risk: discussed and prefers to do at [facility] ?GC/CT not at high risk and not ordered. ?Lipid panel (nonfasting or fasting) discussed based upon AHA recommendations and not ordered.  Consider repeat every 4-6 years.  ?Reviewed risk factors for latent tuberculosis and not indicated ? ?Discussed family history, BRCA testing not indicated. Aunt had breast cancer in her 72s, no first degree relatives with breast cancer. ?Cervical cancer screening: prior Pap reviewed, repeat due in April 2024 ?Immunizations: received TDAP today. ? ?Follow up in 1 year or sooner if indicated.  ? ? ?Gladys Damme, MD ?[place]  ? ?

## 2021-12-24 NOTE — Assessment & Plan Note (Signed)
Patient has started to exercise regularly. Previous A1c's all normal. Commended her for increasing exercise. No complaints today. ?

## 2022-02-01 ENCOUNTER — Encounter: Payer: Self-pay | Admitting: *Deleted

## 2022-04-11 ENCOUNTER — Ambulatory Visit (HOSPITAL_COMMUNITY)
Admission: EM | Admit: 2022-04-11 | Discharge: 2022-04-11 | Disposition: A | Discharge status: Home / Self Care | Payer: BC Managed Care – PPO | Attending: Emergency Medicine | Admitting: Emergency Medicine

## 2022-04-11 DIAGNOSIS — L0231 Cutaneous abscess of buttock: Secondary | ICD-10-CM

## 2022-04-11 MED ORDER — DOXYCYCLINE HYCLATE 100 MG PO CAPS: 100.0000 mg | ORAL_CAPSULE | Freq: Two times a day (BID) | ORAL | 0 refills | Status: AC

## 2022-04-11 NOTE — ED Triage Notes (Signed)
Pt is here for abscess on buttocks x5days. Pt denies any signs of fever

## 2022-04-11 NOTE — Discharge Instructions (Signed)
Take doxycycline every morning and every evening for the next 7 days  Hold warm-hot compresses to affected area at least 4 times a day, this helps to facilitate draining, the more the better  You may use Tylenol and/or ibuprofen every 6 hours either together or alternating to manage discomfort  Please return for evaluation for increased swelling, increased tenderness or pain, non healing site, non draining site, you begin to have fever or chills   You have been given a referral to dermatology, please reach out to schedule an appointment, information is on the front page, it may take 2 months to be seen, unfortunately on the website the [facility] dermatology center is not currently excepting new patients however you may continue to check availability online  We reviewed the etiology of recurrent abscesses of skin.  Skin abscesses are collections of pus within the dermis and deeper skin tissues. Skin abscesses manifest as painful, tender, fluctuant, and erythematous nodules, frequently surmounted by a pustule and surrounded by a rim of erythematous swelling.  Spontaneous drainage of purulent material may occur.  Fever can occur on occasion.    -Skin abscesses can develop in healthy individuals with no predisposing conditions other than skin or nasal carriage of Staphylococcus aureus.  Individuals in close contact with others who have active infection with skin abscesses are at increased risk which is likely to explain why twin brother has similar episodes.   In addition, any process leading to a breach in the skin barrier can also predispose to the development of a skin abscesses, such as atopic dermatitis.

## 2022-04-11 NOTE — ED Provider Notes (Signed)
[facility]    CSN: [id number] Arrival date & time: 04/11/22  2683      History   Chief Complaint Chief Complaint  Patient presents with   Abscess    Abscess on buttocks painful and red     HPI NIAMH RADA is a 26 y.o. female.   Patient presents with abscess to the left buttocks for 5 days.  Has increased in size and become painful to sit.  Has attempted use of Hibiclens cleanses every other day, use of antibacterial soap daily and warm compression which has been minimally helpful.  History of hidradenitis.  Denies fever, chills or drainage.     Past Medical History:  Diagnosis Date   Obesity     Patient Active Problem List   Diagnosis Date Noted   Encounter for removal and reinsertion of Nexplanon 12/31/2019   Hidradenitis suppurativa 09/24/2019   Contraception 01/02/2017   Positive pregnancy test 12/15/2016   Preventative health care 10/29/2012   Family planning counseling 03/29/2011   Hidradenitis 09/14/2009   Severe obesity (BMI >= 40) (HCC) 07/29/2008   ALLERGIC RHINITIS DUE TO POLLEN 07/29/2008    No past surgical history on file.  OB History   No obstetric history on file.      Home Medications    Prior to Admission medications   Medication Sig Start Date End Date Taking? Authorizing Provider  clindamycin (CLEOCIN-T) 1 % external solution Apply topically 2 (two) times daily. 09/24/19   Allayne Stack, DO  hydrocortisone 2.5 % lotion Apply topically 2 (two) times daily. 09/15/15   Everlene Farrier, PA-C  Prenatal Vit-Fe Fumarate-FA (PRENATAL VITAMINS) 28-0.8 MG TABS Take 1 tablet by mouth daily. 11/04/16   Domenick Gong, MD  silver sulfADIAZINE (SILVADENE) 1 % cream Apply 1 application. topically daily. 12/24/21   Shirlean Mylar, MD    Family History Family History  Problem Relation Age of Onset   Alcohol abuse Mother    Depression Mother    Drug abuse Mother    Drug abuse Father    Diabetes Paternal Grandmother     Social  History Social History   Tobacco Use   Smoking status: Never   Smokeless tobacco: Never  Substance Use Topics   Alcohol use: No   Drug use: No     Allergies   Patient has no known allergies.   Review of Systems Review of Systems  Constitutional: Negative.   Respiratory: Negative.    Cardiovascular: Negative.   Skin:  Positive for wound. Negative for color change, pallor and rash.  Neurological: Negative.      Physical Exam Triage Vital Signs ED Triage Vitals  Enc Vitals Group     BP 04/11/22 0824 109/72     Pulse Rate 04/11/22 0824 (!) 103     Resp 04/11/22 0824 12     Temp 04/11/22 0824 98.1 F (36.7 C)     Temp Source 04/11/22 0824 Oral     SpO2 04/11/22 0824 99 %     Weight 04/11/22 0826 294 lb (133.4 kg)     Height 04/11/22 0826 5\' 9"  (1.753 m)     Head Circumference --      Peak Flow --      Pain Score 04/11/22 0825 8     Pain Loc --      Pain Edu? --      Excl. in GC? --    No data found.  Updated Vital Signs BP 109/72 (BP Location:  Left Arm)   Pulse (!) 103   Temp 98.1 F (36.7 C) (Oral)   Resp 12   Ht 5\' 9"  (1.753 m)   Wt 294 lb (133.4 kg)   SpO2 99%   BMI 43.42 kg/m   Visual Acuity Right Eye Distance:   Left Eye Distance:   Bilateral Distance:    Right Eye Near:   Left Eye Near:    Bilateral Near:     Physical Exam Constitutional:      Appearance: Normal appearance.  HENT:     Head: Normocephalic.  Eyes:     Extraocular Movements: Extraocular movements intact.  Pulmonary:     Effort: Pulmonary effort is normal.  Skin:    Comments: 2 x 3 immature abscess present to the left buttocks along the gluteal cleft, tender to palpation, nondraining  Neurological:     Mental Status: She is alert and oriented to person, place, and time. Mental status is at baseline.  Psychiatric:        Mood and Affect: Mood normal.        Behavior: Behavior normal.      UC Treatments / Results  Labs (all labs ordered are listed, but only  abnormal results are displayed) Labs Reviewed - No data to display  EKG   Radiology No results found.  Procedures Procedures (including critical care time)  Medications Ordered in UC Medications - No data to display  Initial Impression / Assessment and Plan / UC Course  I have reviewed the triage vital signs and the nursing notes.  Pertinent labs & imaging results that were available during my care of the patient were reviewed by me and considered in my medical decision making (see chart for details).  Abscess of left buttock  At this time abscess is unable to be drained in office, discussed with patient, doxycycline 7-day course prescribed and recommended continued use of warm compresses and good hygiene, may also use over-the-counter analgesics for management of discomfort, given strict precautions to return to urgent care for nonhealing or nondraining site for reevaluation given walker referral to dermatology for general management of hidradenitis Final Clinical Impressions(s) / UC Diagnoses   Final diagnoses:  None   Discharge Instructions   None    ED Prescriptions   None    PDMP not reviewed this encounter.   , Valinda Hoar 04/11/22 [phone]

## 2023-01-05 ENCOUNTER — Ambulatory Visit (INDEPENDENT_AMBULATORY_CARE_PROVIDER_SITE_OTHER): Payer: Self-pay | Admitting: Family Medicine

## 2023-01-05 VITALS — BP 112/64 | HR 74 | Wt 290.0 lb

## 2023-01-05 DIAGNOSIS — Z3046 Encounter for surveillance of implantable subdermal contraceptive: Secondary | ICD-10-CM

## 2023-01-05 DIAGNOSIS — Z308 Encounter for other contraceptive management: Secondary | ICD-10-CM

## 2023-01-05 DIAGNOSIS — Z3040 Encounter for surveillance of contraceptives, unspecified: Secondary | ICD-10-CM

## 2023-01-05 DIAGNOSIS — Z30017 Encounter for initial prescription of implantable subdermal contraceptive: Secondary | ICD-10-CM

## 2023-01-05 LAB — POCT URINE PREGNANCY: Preg Test, Ur: NEGATIVE

## 2023-01-05 NOTE — Patient Instructions (Signed)
you just got NEXPLANON Here is some helpful information to keep you informed. And if you have any questions, please consult your doctor.  24 Hours wear your top bandage The compression bandage helps minimize bruising.  3-5 Days keep your implant site covered While the insertion site is healing, keep the area covered with a smaller bandage.

## 2023-01-06 NOTE — Progress Notes (Signed)
PROCEDURE NOTE:  Patient given informed consent, signed copy in the chart. Negative pregnancy test ; appropriate time out had been taken  Sterile technique used;  local anesthesia was 3 cc 1% lidocaine without epi.Marland Kitchen   Nexplanon was inserted in typical fashion in the  RIGHT  ARM . There were no complications.  Pressure bandage was  applied to decrease bruising. Patient given follow up instructions should she experience redness, swelling at sight or fever in the next 24 hours. Patient given Nexplanon pocket card.  Dr. Laroy Apple resident performing procedure. I was present and assisted.

## 2023-01-12 MED ORDER — ETONOGESTREL 68 MG ~~LOC~~ IMPL
68.0000 mg | DRUG_IMPLANT | Freq: Once | SUBCUTANEOUS | Status: AC
  Administered 2023-01-05: 68 mg via SUBCUTANEOUS

## 2023-01-12 NOTE — Addendum Note (Signed)
Addended by: Veronda Prude on: 01/12/2023 09:02 AM   Modules accepted: Orders

## 2023-11-18 ENCOUNTER — Encounter (HOSPITAL_COMMUNITY): Payer: Self-pay | Admitting: Emergency Medicine

## 2023-11-18 ENCOUNTER — Ambulatory Visit (HOSPITAL_COMMUNITY)
Admission: EM | Admit: 2023-11-18 | Discharge: 2023-11-18 | Disposition: A | Discharge status: Home / Self Care | Attending: Family Medicine | Admitting: Family Medicine

## 2023-11-18 ENCOUNTER — Other Ambulatory Visit: Payer: Self-pay

## 2023-11-18 DIAGNOSIS — M654 Radial styloid tenosynovitis [de Quervain]: Secondary | ICD-10-CM | POA: Diagnosis not present

## 2023-11-18 MED ORDER — NAPROXEN 500 MG PO TABS: 500.0000 mg | ORAL_TABLET | Freq: Two times a day (BID) | ORAL | 0 refills | Status: AC

## 2023-11-18 NOTE — ED Triage Notes (Signed)
 Pt c/o right wrist pain radiating to her right thumb for over a week, denies any fall or injury.

## 2023-11-22 NOTE — ED Provider Notes (Signed)
  [facility]   [id number] 11/18/23 Arrival Time: 1433  ASSESSMENT & PLAN:  1. Tenosynovitis, de Quervain    No indication for imaging. Begin: Discharge Medication List as of 11/18/2023  4:31 PM     START taking these medications   Details  naproxen (NAPROSYN) 500 MG tablet Take 1 tablet (500 mg total) by mouth 2 (two) times daily with a meal., Starting Sat 11/18/2023, Normal        Orders Placed This Encounter  Procedures   Apply Thumb spica   Work/school excuse note: provided. Recommend:  Follow-up Information     Chiaramonti, Edsel Petrin, MD.   Specialties: Orthopedic Surgery, Hand Surgery Why: If worsening or failing to improve as anticipated. Contact information: [address] [place] [address] [phone]                 Reviewed expectations re: course of current medical issues. Questions answered. Outlined signs and symptoms indicating need for more acute intervention. Patient verbalized understanding. After Visit Summary given.  SUBJECTIVE: History from: patient. Stacey Hendrix is a 28 y.o. female who reports right wrist pain radiating to her right thumb for over a week, denies any fall or injury.  No extremity sensation changes or weakness. No tx PTA. Pain worse with gripping objects.   OBJECTIVE:  Vitals:   11/18/23 1521 11/18/23 1523  BP:  106/72  Pulse:  73  Resp:  18  Temp: 98.3 F (36.8 C)   TempSrc: Oral   SpO2:  98%    General appearance: alert; no distress HEENT: [place]; AT Neck: supple with FROM Resp: unlabored respirations Extremities: RUE: warm with well perfused appearance; pain reported over radial side of wrist, more notable with thumb and wrist movement; no erythema or inflammation; no swelling; FROM; very tender over radial styloid CV: brisk extremity capillary refill of RUE; 2+ radial pulse of RUE. Skin: warm and dry; no visible rashes Neurologic: gait normal; normal sensation and strength of  RUE Psychological: alert and cooperative; normal mood and affect    No Known Allergies  Past Medical History:  Diagnosis Date   Obesity    Social History   Socioeconomic History   Marital status: Single    Spouse name: Not on file   Number of children: Not on file   Years of education: Not on file   Highest education level: Not on file  Occupational History   Not on file  Tobacco Use   Smoking status: Never    Passive exposure: Never   Smokeless tobacco: Never  Substance and Sexual Activity   Alcohol use: No   Drug use: No   Sexual activity: Yes    Birth control/protection: Implant  Other Topics Concern   Not on file  Social History Narrative   Not on file   Social Drivers of Health   Financial Resource Strain: Not on file  Food Insecurity: Not on file  Transportation Needs: Not on file  Physical Activity: Not on file  Stress: Not on file  Social Connections: Not on file   Family History  Problem Relation Age of Onset   Alcohol abuse Mother    Depression Mother    Drug abuse Mother    Drug abuse Father    Diabetes Paternal Grandmother    History reviewed. No pertinent surgical history.     Mardella Layman, MD 11/22/23 [phone]
